# Patient Record
Sex: Male | Born: 1940 | Race: White | Hispanic: No | Marital: Married | Smoking: Never smoker
Health system: Southern US, Community
[De-identification: ages and names within clinical notes are randomized; demographics above are authoritative.]

## PROBLEM LIST (undated history)

## (undated) DIAGNOSIS — I10 Essential (primary) hypertension: Secondary | ICD-10-CM

## (undated) DIAGNOSIS — E119 Type 2 diabetes mellitus without complications: Secondary | ICD-10-CM

## (undated) DIAGNOSIS — I2510 Atherosclerotic heart disease of native coronary artery without angina pectoris: Secondary | ICD-10-CM

## (undated) DIAGNOSIS — Z952 Presence of prosthetic heart valve: Secondary | ICD-10-CM

## (undated) DIAGNOSIS — I00 Rheumatic fever without heart involvement: Secondary | ICD-10-CM

## (undated) DIAGNOSIS — I219 Acute myocardial infarction, unspecified: Secondary | ICD-10-CM

## (undated) HISTORY — PX: ORIF ANKLE FRACTURE: SUR919

## (undated) HISTORY — PX: WRIST SURGERY: SHX841

## (undated) HISTORY — PX: CORONARY ARTERY BYPASS GRAFT: SHX141

---

## 2012-04-27 ENCOUNTER — Emergency Department: Payer: Self-pay | Admitting: Emergency Medicine

## 2012-04-27 LAB — CBC
HCT: 40.5 % (ref 40.0–52.0)
HGB: 13.6 g/dL (ref 13.0–18.0)
MCHC: 33.7 g/dL (ref 32.0–36.0)
MCV: 85 fL (ref 80–100)
Platelet: 162 10*3/uL (ref 150–440)
RDW: 14.6 % — ABNORMAL HIGH (ref 11.5–14.5)
WBC: 9.2 10*3/uL (ref 3.8–10.6)

## 2012-04-27 LAB — BASIC METABOLIC PANEL
Anion Gap: 6 — ABNORMAL LOW (ref 7–16)
Chloride: 105 mmol/L (ref 98–107)
Co2: 29 mmol/L (ref 21–32)
Creatinine: 1.21 mg/dL (ref 0.60–1.30)
EGFR (African American): >60
EGFR (Non-African Amer.): 60 — ABNORMAL LOW
Glucose: 136 mg/dL — ABNORMAL HIGH (ref 65–99)
Sodium: 140 mmol/L (ref 136–145)

## 2012-04-27 LAB — TROPONIN I: Troponin-I: 0.04 ng/mL

## 2012-05-03 ENCOUNTER — Ambulatory Visit: Payer: Self-pay | Admitting: Internal Medicine

## 2012-06-26 ENCOUNTER — Observation Stay: Payer: Self-pay | Admitting: Internal Medicine

## 2012-06-26 LAB — URINALYSIS, COMPLETE
Hyaline Cast: 4
Ketone: NEGATIVE
Leukocyte Esterase: NEGATIVE
Nitrite: NEGATIVE
Ph: 6 (ref 4.5–8.0)
RBC,UR: <1 /HPF (ref 0–5)
Specific Gravity: 1.012 (ref 1.003–1.030)

## 2012-06-26 LAB — CBC
HCT: 35.3 % — ABNORMAL LOW (ref 40.0–52.0)
MCH: 27.4 pg (ref 26.0–34.0)
Platelet: 320 10*3/uL (ref 150–440)
RBC: 4.25 10*6/uL — ABNORMAL LOW (ref 4.40–5.90)
WBC: 14.4 10*3/uL — ABNORMAL HIGH (ref 3.8–10.6)

## 2012-06-26 LAB — COMPREHENSIVE METABOLIC PANEL
BUN: 27 mg/dL — ABNORMAL HIGH (ref 7–18)
Bilirubin,Total: 0.9 mg/dL (ref 0.2–1.0)
Calcium, Total: 8.7 mg/dL (ref 8.5–10.1)
Chloride: 102 mmol/L (ref 98–107)
Co2: 29 mmol/L (ref 21–32)
Creatinine: 1.35 mg/dL — ABNORMAL HIGH (ref 0.60–1.30)
EGFR (African American): >60
EGFR (Non-African Amer.): 52 — ABNORMAL LOW
Potassium: 4 mmol/L (ref 3.5–5.1)
SGOT(AST): 20 U/L (ref 15–37)
SGPT (ALT): 20 U/L (ref 12–78)
Sodium: 138 mmol/L (ref 136–145)
Total Protein: 6.9 g/dL (ref 6.4–8.2)

## 2012-06-26 LAB — CK TOTAL AND CKMB (NOT AT ARMC)
CK, Total: 25 U/L — ABNORMAL LOW (ref 35–232)
CK-MB: 1 ng/mL (ref 0.5–3.6)

## 2012-06-26 LAB — TROPONIN I: Troponin-I: <0.02 ng/mL

## 2012-06-27 LAB — CBC WITH DIFFERENTIAL/PLATELET
Basophil #: 0.1 10*3/uL (ref 0.0–0.1)
Basophil %: 1 %
Eosinophil #: 0.4 10*3/uL (ref 0.0–0.7)
HCT: 29.7 % — ABNORMAL LOW (ref 40.0–52.0)
HGB: 9.9 g/dL — ABNORMAL LOW (ref 13.0–18.0)
Lymphocyte #: 1.5 10*3/uL (ref 1.0–3.6)
Lymphocyte %: 14.7 %
MCH: 27.4 pg (ref 26.0–34.0)
MCHC: 33.4 g/dL (ref 32.0–36.0)
MCV: 82 fL (ref 80–100)
Monocyte %: 6.7 %
Neutrophil #: 7.4 10*3/uL — ABNORMAL HIGH (ref 1.4–6.5)
Neutrophil %: 73.3 %
Platelet: 264 10*3/uL (ref 150–440)
RBC: 3.61 10*6/uL — ABNORMAL LOW (ref 4.40–5.90)
RDW: 15.6 % — ABNORMAL HIGH (ref 11.5–14.5)

## 2012-06-27 LAB — BASIC METABOLIC PANEL
Anion Gap: 4 — ABNORMAL LOW (ref 7–16)
Anion Gap: 6 — ABNORMAL LOW (ref 7–16)
BUN: 27 mg/dL — ABNORMAL HIGH (ref 7–18)
BUN: 24 mg/dL — ABNORMAL HIGH (ref 7–18)
Chloride: 106 mmol/L (ref 98–107)
Chloride: 105 mmol/L (ref 98–107)
Co2: 29 mmol/L (ref 21–32)
Creatinine: 1.26 mg/dL (ref 0.60–1.30)
EGFR (Non-African Amer.): 54 — ABNORMAL LOW
Glucose: 107 mg/dL — ABNORMAL HIGH (ref 65–99)
Glucose: 121 mg/dL — ABNORMAL HIGH (ref 65–99)
Osmolality: 279 (ref 275–301)
Potassium: 3.8 mmol/L (ref 3.5–5.1)
Potassium: 4.3 mmol/L (ref 3.5–5.1)
Sodium: 137 mmol/L (ref 136–145)

## 2012-06-27 LAB — LIPID PANEL
HDL Cholesterol: 43 mg/dL (ref 40–60)
Ldl Cholesterol, Calc: 136 mg/dL — ABNORMAL HIGH (ref 0–100)
Triglycerides: 107 mg/dL (ref 0–200)

## 2012-06-27 LAB — TROPONIN I: Troponin-I: 0.02 ng/mL

## 2012-06-27 LAB — HEMOGLOBIN A1C: Hemoglobin A1C: 6.1 % (ref 4.2–6.3)

## 2012-07-18 ENCOUNTER — Encounter: Payer: Self-pay | Admitting: Cardiothoracic Surgery

## 2012-07-23 ENCOUNTER — Encounter: Payer: Self-pay | Admitting: Cardiothoracic Surgery

## 2012-08-22 ENCOUNTER — Encounter: Payer: Self-pay | Admitting: Cardiothoracic Surgery

## 2012-09-09 ENCOUNTER — Emergency Department: Payer: Self-pay | Admitting: Emergency Medicine

## 2012-09-09 LAB — CBC
HCT: 40.6 % (ref 40.0–52.0)
HGB: 13.5 g/dL (ref 13.0–18.0)
MCHC: 33.2 g/dL (ref 32.0–36.0)
Platelet: 163 10*3/uL (ref 150–440)
RBC: 5.04 10*6/uL (ref 4.40–5.90)
RDW: 17.1 % — ABNORMAL HIGH (ref 11.5–14.5)
WBC: 8.6 10*3/uL (ref 3.8–10.6)

## 2012-09-09 LAB — BASIC METABOLIC PANEL
Anion Gap: 5 — ABNORMAL LOW (ref 7–16)
BUN: 19 mg/dL — ABNORMAL HIGH (ref 7–18)
Calcium, Total: 9 mg/dL (ref 8.5–10.1)
Glucose: 123 mg/dL — ABNORMAL HIGH (ref 65–99)
Potassium: 3.9 mmol/L (ref 3.5–5.1)
Sodium: 137 mmol/L (ref 136–145)

## 2012-09-09 LAB — TROPONIN I: Troponin-I: <0.02 ng/mL

## 2012-09-22 ENCOUNTER — Encounter: Payer: Self-pay | Admitting: Cardiothoracic Surgery

## 2012-10-22 ENCOUNTER — Encounter: Payer: Self-pay | Admitting: Cardiothoracic Surgery

## 2012-11-22 ENCOUNTER — Encounter: Payer: Self-pay | Admitting: Cardiothoracic Surgery

## 2014-03-10 ENCOUNTER — Ambulatory Visit: Payer: Self-pay | Admitting: Internal Medicine
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
## 2014-08-14 NOTE — Discharge Summary (Signed)
PATIENT NAME:  Malik Dean, Revere MR#:  [id number] DATE OF BIRTH:  1940/12/27  DATE OF ADMISSION:  06/26/2012 DATE OF DISCHARGE:  06/27/2012  DISCHARGE DIAGNOSES:  1. Syncopal episode due to hypotension as a result of dehydration.  2. Acute renal failure due to dehydration. 3. Hypertension.  4. Diabetes. 5. Valvular heart disease status post valvular surgery.   PRIMARY CARDIOLOGIST: Dwayne D. Callwood, MD   HISTORY OF PRESENTING ILLNESS: A 74 year old Caucasian male with past medical history of significant hypertension, diabetes, coronary artery disease and aortic valvular regurgitation, status post recent aortic valve replacement at [facility] 10 days ago, brought to the hospital secondary to syncopal episode. The patient said that he had cardiac catheterization done by Dr. Juliann Paresallwood at the end of January and was found 100% RCA blockage with collateral and also had aortic valve regurgitation and [facility] He had aortic valve replacement 10 days ago, and that relieved his angina and dyspnea symptoms. [facility] and KCl. Has been eating and drinking fine. Not had any symptoms up until the morning of the day of presentation to the Emergency Room. He was feeling dizzy. Had 2 syncopal episodes and denied any chest pain prior to the episode. His blood pressure in the ER was on the lower side, which was corrected with IV hydration, and so he was admitted for observation for syncopal episode.   HOSPITAL COURSE AND STAY:  1. Syncopal episode was due to dehydration when he came with hypotension, and he was using Lasix increased dose [facility]. Echocardiogram was done, which was not significant. Cardiology consult was done by Dr. Juliann Paresallwood, and he suggested to discharge home as the patient was feeling fine after IV hydration.  2. Acute renal failure, likely this was due to prerenal from dehydration. Gentle IV hydration, and we held the Lasix. Metformin was also  held, and followup labs showed correction. 3. Leukocytosis, likely due to stress from the fall, and WBC came down later on.  4. Hypertension. We continued metoprolol along with holding parameter, but cut down on Lasix on discharge.  5. Diabetes. We held metformin due to acute renal failure, but then restarted later on on discharge.   IMPORTANT LABORATORY RESULTS: WBC 14.4, hemoglobin 11.6. Glucose 138, BUN 27, creatinine 1.35. Troponin less than 0.02. Chest x-ray: No acute disease. Urinalysis: Negative. US Carotid Doppler: 50% stenosis in proximal left artery. No hemodynamically significant blockages. WBC came down to 10,000. Creatinine improved to 1.31. Echocardiogram done, normal global left ventricular systolic function, moderate asymmetric left ventricular hypertrophy, small pericardial effusion, thickening of the anterior and posterior mitral valve leaflet, aortic valve is normally functioning, mild to moderate aortic valve sclerosis, severely increased left ventricular posterior wall thickness, thick tricuspid valve. CT of the head without contrast consistent with chronic ischemia. Creatinine improved to 1.26 later on.   CONDITION ON DISCHARGE: Stable.   CODE STATUS ON DISCHARGE: Full code.   MEDICATION ON DISCHARGE:  1. Meloxicam 50 mg oral tablet once a day. 2. Allopurinol 300 mg once a day.  3. Metformin 500 mg 2 times a day.  4. Aspirin 81 mg once a day.  5. Magnesium 1 tablet once a day.  6. Atorvastatin 40 mg once a day.  7. KCl 20 mEq once a day. 8. Lisinopril 2.5 mg once a day.  9. Metoprolol 25 mg 1/2-tablet 2 times a day.   DIET ON DISCHARGE: Low sodium, carbohydrate controlled, ADA diet.   DIET  CONSISTENCY: Regular.   ACTIVITY LIMITATION: As tolerated.   TIMEFRAME TO FOLLOWUP: In 1 to 2 weeks with Dr. Juliann Pares for adjustment of cardiac and blood pressure medication.   TOTAL TIME SPENT ON THIS DISCHARGE: 45 minutes.    ____________________________ Malik Dean  Elisabeth Pigeon, MD vgv:OSi D: 07/01/2012 23:53:10 ET T: 07/02/2012 11:35:30 ET JOB#: [id number]  cc: Malik Dean. Elisabeth Pigeon, MD, <Dictator> Dwayne D. Juliann Pares, MD Altamese Dilling MD ELECTRONICALLY SIGNED 07/22/2012 9:26

## 2014-11-19 ENCOUNTER — Emergency Department: Payer: Medicare Other

## 2014-11-19 ENCOUNTER — Emergency Department
Admission: EM | Admit: 2014-11-19 | Discharge: 2014-11-19 | Disposition: A | Discharge status: Home / Self Care | Payer: Medicare Other | Attending: Emergency Medicine | Admitting: Emergency Medicine

## 2014-11-19 DIAGNOSIS — R109 Unspecified abdominal pain: Secondary | ICD-10-CM

## 2014-11-19 DIAGNOSIS — N201 Calculus of ureter: Secondary | ICD-10-CM | POA: Diagnosis not present

## 2014-11-19 DIAGNOSIS — E119 Type 2 diabetes mellitus without complications: Secondary | ICD-10-CM | POA: Insufficient documentation

## 2014-11-19 DIAGNOSIS — I10 Essential (primary) hypertension: Secondary | ICD-10-CM | POA: Insufficient documentation

## 2014-11-19 HISTORY — DX: Type 2 diabetes mellitus without complications: E11.9

## 2014-11-19 HISTORY — DX: Presence of prosthetic heart valve: Z95.2

## 2014-11-19 HISTORY — DX: Acute myocardial infarction, unspecified: I21.9

## 2014-11-19 HISTORY — DX: Essential (primary) hypertension: I10

## 2014-11-19 LAB — URINALYSIS COMPLETE WITH MICROSCOPIC (ARMC ONLY)
BILIRUBIN URINE: NEGATIVE
Bacteria, UA: NONE SEEN
Glucose, UA: NEGATIVE mg/dL
Leukocytes, UA: NEGATIVE
Nitrite: NEGATIVE
Protein, ur: 100 mg/dL — AB
Specific Gravity, Urine: 1.026 (ref 1.005–1.030)
Squamous Epithelial / LPF: NONE SEEN
pH: 5 (ref 5.0–8.0)

## 2014-11-19 LAB — COMPREHENSIVE METABOLIC PANEL
ALBUMIN: 3.4 g/dL — AB (ref 3.5–5.0)
ALT: 14 U/L — AB (ref 17–63)
AST: 17 U/L (ref 15–41)
Alkaline Phosphatase: 64 U/L (ref 38–126)
Anion gap: 10 (ref 5–15)
BUN: 32 mg/dL — ABNORMAL HIGH (ref 6–20)
CHLORIDE: 103 mmol/L (ref 101–111)
CO2: 27 mmol/L (ref 22–32)
Calcium: 9 mg/dL (ref 8.9–10.3)
Creatinine, Ser: 1.94 mg/dL — ABNORMAL HIGH (ref 0.61–1.24)
GFR calc Af Amer: 38 mL/min — ABNORMAL LOW (ref >60)
GFR calc non Af Amer: 33 mL/min — ABNORMAL LOW (ref >60)
Glucose, Bld: 149 mg/dL — ABNORMAL HIGH (ref 65–99)
Potassium: 4 mmol/L (ref 3.5–5.1)
Sodium: 140 mmol/L (ref 135–145)
Total Bilirubin: 1.9 mg/dL — ABNORMAL HIGH (ref 0.3–1.2)
Total Protein: 6.8 g/dL (ref 6.5–8.1)

## 2014-11-19 LAB — CBC
HCT: 43.4 % (ref 40.0–52.0)
HEMOGLOBIN: 14.3 g/dL (ref 13.0–18.0)
MCH: 28 pg (ref 26.0–34.0)
MCHC: 33 g/dL (ref 32.0–36.0)
MCV: 84.8 fL (ref 80.0–100.0)
Platelets: 124 10*3/uL — ABNORMAL LOW (ref 150–440)
RBC: 5.13 MIL/uL (ref 4.40–5.90)
RDW: 14.7 % — ABNORMAL HIGH (ref 11.5–14.5)
WBC: 11.7 10*3/uL — ABNORMAL HIGH (ref 3.8–10.6)

## 2014-11-19 LAB — LIPASE, BLOOD: Lipase: 21 U/L — ABNORMAL LOW (ref 22–51)

## 2014-11-19 MED ORDER — TAMSULOSIN HCL 0.4 MG PO CAPS: 0.4000 mg | ORAL_CAPSULE | Freq: Every day | ORAL | Status: DC

## 2014-11-19 MED ORDER — OXYCODONE-ACETAMINOPHEN 5-325 MG PO TABS: 1.0000 | ORAL_TABLET | Freq: Four times a day (QID) | ORAL | Status: DC | PRN

## 2014-11-19 NOTE — Discharge Instructions (Signed)
Please call the number provided to follow-up with a urologist as soon as possible. Please bring the CT report your primary care doctor to arrange a follow-up CT in 3-6 months to reevaluate your kidney cyst. Please return to the emergency department for any worsening pain, fever, nausea or vomiting, or any other symptom personally concerning to yourself.   Kidney Stones Kidney stones (urolithiasis) are deposits that form inside your kidneys. The intense pain is caused by the stone moving through the urinary tract. When the stone moves, the ureter goes into spasm around the stone. The stone is usually passed in the urine.  CAUSES   A disorder that makes certain neck glands produce too much parathyroid hormone (primary hyperparathyroidism).  A buildup of uric acid crystals, similar to gout in your joints.  Narrowing (stricture) of the ureter.  A kidney obstruction present at birth (congenital obstruction).  Previous surgery on the kidney or ureters.  Numerous kidney infections. SYMPTOMS   Feeling sick to your stomach (nauseous).  Throwing up (vomiting).  Blood in the urine (hematuria).  Pain that usually spreads (radiates) to the groin.  Frequency or urgency of urination. DIAGNOSIS   Taking a history and physical exam.  Blood or urine tests.  CT scan.  Occasionally, an examination of the inside of the urinary bladder (cystoscopy) is performed. TREATMENT   Observation.  Increasing your fluid intake.  Extracorporeal shock wave lithotripsy--This is a noninvasive procedure that uses shock waves to break up kidney stones.  Surgery may be needed if you have severe pain or persistent obstruction. There are various surgical procedures. Most of the procedures are performed with the use of small instruments. Only small incisions are needed to accommodate these instruments, so recovery time is minimized. The size, location, and chemical composition are all important variables that  will determine the proper choice of action for you. Talk to your health care provider to better understand your situation so that you will minimize the risk of injury to yourself and your kidney.  HOME CARE INSTRUCTIONS   Drink enough water and fluids to keep your urine clear or pale yellow. This will help you to pass the stone or stone fragments.  Strain all urine through the provided strainer. Keep all particulate matter and stones for your health care provider to see. The stone causing the pain may be as small as a grain of salt. It is very important to use the strainer each and every time you pass your urine. The collection of your stone will allow your health care provider to analyze it and verify that a stone has actually passed. The stone analysis will often identify what you can do to reduce the incidence of recurrences.  Only take over-the-counter or prescription medicines for pain, discomfort, or fever as directed by your health care provider.  Make a follow-up appointment with your health care provider as directed.  Get follow-up X-rays if required. The absence of pain does not always mean that the stone has passed. It may have only stopped moving. If the urine remains completely obstructed, it can cause loss of kidney function or even complete destruction of the kidney. It is your responsibility to make sure X-rays and follow-ups are completed. Ultrasounds of the kidney can show blockages and the status of the kidney. Ultrasounds are not associated with any radiation and can be performed easily in a matter of minutes. SEEK MEDICAL CARE IF:  You experience pain that is progressive and unresponsive to any pain medicine you  have been prescribed. SEEK IMMEDIATE MEDICAL CARE IF:   Pain cannot be controlled with the prescribed medicine.  You have a fever or shaking chills.  The severity or intensity of pain increases over 18 hours and is not relieved by pain medicine.  You develop a new  onset of abdominal pain.  You feel faint or pass out.  You are unable to urinate. MAKE SURE YOU:   Understand these instructions.  Will watch your condition.  Will get help right away if you are not doing well or get worse. Document Released: 04/10/2005 Document Revised: 12/11/2012 Document Reviewed: 09/11/2012 [facility] Patient Information 2015 [place], [place]. This information is not intended to replace advice given to you by your health care provider. Make sure you discuss any questions you have with your health care provider.

## 2014-11-19 NOTE — ED Provider Notes (Signed)
[facility] Emergency Department Provider Note  Time seen: 11:10 AM  I have reviewed the triage vital signs and the nursing notes.   HISTORY  Chief Complaint Flank Pain and Abdominal Pain    HPI Malik Dean is a 74 y.o. male with a past medical history of hypertension, diabetes, MI, presents the emergency department with left-sided abdominal flank pain. According to the patient for the past 1 week he's had intermittent left flank pain. States the pain comes and goes, it was severe this morning but is now largely resolved. Denies any dysuria, hematuria. Denies any nausea, vomiting, diarrhea. His last bowel movement was yesterday and normal. Denies any chest pain or shortness of breath. Denies any fever. Describes his abdominal pain currently is gone. When the abdominal pain is severe he states it feels like a stabbing type pain.No modifying factors identified.     Past Medical History  Diagnosis Date  . Hypertension   . Diabetes mellitus without complication   . Heart valve replaced   . MI (myocardial infarction)     There are no active problems to display for this patient.   History reviewed. No pertinent past surgical history.  No current outpatient prescriptions on file.  Allergies Review of patient's allergies indicates no known allergies.  No family history on file.  Social History History  Substance Use Topics  . Smoking status: Never Smoker   . Smokeless tobacco: Not on file  . Alcohol Use: No    Review of Systems Constitutional: Negative for fever. Cardiovascular: Negative for chest pain. Respiratory: Negative for shortness of breath. Gastrointestinal: Left-sided flank pain. Genitourinary: Negative for dysuria. Negative for hematuria. Musculoskeletal: Negative for back pain. 10-point ROS otherwise negative.  ____________________________________________   PHYSICAL EXAM:  VITAL SIGNS: ED Triage Vitals  Enc Vitals Group   BP 11/19/14 1036 181/78 mmHg     Pulse Rate 11/19/14 1036 87     Resp 11/19/14 1036 18     Temp 11/19/14 1036 98.4 F (36.9 C)     Temp Source 11/19/14 1036 Oral     SpO2 11/19/14 1036 95 %     Weight 11/19/14 1036 300 lb (136.079 kg)     Height 11/19/14 1036 6' (1.829 m)     Head Cir --      Peak Flow --      Pain Score 11/19/14 1042 5     Pain Loc --      Pain Edu? --      Excl. in GC? --     Constitutional: Alert and oriented. Well appearing and in no distress. Eyes: Normal exam ENT   Mouth/Throat: Mucous membranes are moist. Cardiovascular: Normal rate, regular rhythm. No murmur Respiratory: Normal respiratory effort without tachypnea nor retractions. Breath sounds are clear and equal bilaterally. No wheezes/rales/rhonchi. Gastrointestinal: Soft, obese, nontender to palpation. No distention. No CVA tenderness palpation. Musculoskeletal: Nontender with normal range of motion in all extremities.  Neurologic:  Normal speech and language. No gross focal neurologic deficits Skin:  Skin is warm, dry and intact.  Psychiatric: Mood and affect are normal. Speech and behavior are normal.  ____________________________________________   RADIOLOGY  CT consistent with left kidney stone  ____________________________________________    INITIAL IMPRESSION / ASSESSMENT AND PLAN / ED COURSE  Pertinent labs & imaging results that were available during my care of the patient were reviewed by me and considered in my medical decision making (see chart for details).  Patient with left-sided flank pain times  one week. States the pain as intermittent, sharp and severe when present, and will completely resolved between episodes. Denies dysuria or hematuria. Denies any history of kidney stones. We will check labs, closely monitor, and proceed with CT scan of the abdomen to rule out a kidney stone.  CT consistent with ureterolithiasis. Labs are largely within normal limits. Urinalysis is most  consistent with ureterolithiasis as well. Patient states he is feeling much better currently. We'll discharge the patient home on pain medication and Flomax. Patient follow-up with a urologist. I also discussed with the patient renal cysts found on CT scan. I printed out the CT scan report to the patient to bring to his primary care doctor to arrange a follow-up CT scan in 3-6 months. Patient is agreeable.  ____________________________________________   FINAL CLINICAL IMPRESSION(S) / ED DIAGNOSES  Left flank pain Ureterolithiasis  Minna Antis, MD 11/19/14 1334

## 2014-11-19 NOTE — ED Notes (Signed)
Pt c/o left sided flank pain since "the weekend". Denies nausea, vomiting or diarrhea.

## 2014-11-19 NOTE — ED Notes (Signed)
Pt alert and oriented X4, active, cooperative, pt in NAD. RR even and unlabored, color WNL.    

## 2014-12-14 ENCOUNTER — Ambulatory Visit: Payer: Self-pay | Admitting: Urology

## 2015-05-12 ENCOUNTER — Other Ambulatory Visit: Payer: Self-pay | Admitting: Neurology

## 2015-05-12 DIAGNOSIS — R413 Other amnesia: Secondary | ICD-10-CM

## 2015-05-12 DIAGNOSIS — R4701 Aphasia: Secondary | ICD-10-CM

## 2015-05-24 ENCOUNTER — Ambulatory Visit
Admission: RE | Admit: 2015-05-24 | Discharge: 2015-05-24 | Disposition: A | Discharge status: Home / Self Care | Payer: Medicare Other | Source: Ambulatory Visit | Attending: Neurology | Admitting: Neurology

## 2015-05-24 DIAGNOSIS — R413 Other amnesia: Secondary | ICD-10-CM | POA: Insufficient documentation

## 2015-05-24 DIAGNOSIS — R4701 Aphasia: Secondary | ICD-10-CM | POA: Diagnosis present

## 2015-05-24 DIAGNOSIS — G319 Degenerative disease of nervous system, unspecified: Secondary | ICD-10-CM | POA: Insufficient documentation

## 2015-05-24 DIAGNOSIS — I679 Cerebrovascular disease, unspecified: Secondary | ICD-10-CM | POA: Insufficient documentation

## 2015-11-29 ENCOUNTER — Inpatient Hospital Stay
Admission: EM | Admit: 2015-11-29 | Discharge: 2015-11-30 | DRG: 312 | Disposition: A | Discharge status: Home Health | Payer: Medicare Other | Attending: Internal Medicine | Admitting: Internal Medicine

## 2015-11-29 ENCOUNTER — Emergency Department: Payer: Medicare Other

## 2015-11-29 ENCOUNTER — Inpatient Hospital Stay: Admit: 2015-11-29 | Payer: Medicare Other

## 2015-11-29 ENCOUNTER — Encounter: Payer: Self-pay | Admitting: Emergency Medicine

## 2015-11-29 DIAGNOSIS — F0390 Unspecified dementia without behavioral disturbance: Secondary | ICD-10-CM | POA: Diagnosis present

## 2015-11-29 DIAGNOSIS — G458 Other transient cerebral ischemic attacks and related syndromes: Secondary | ICD-10-CM | POA: Diagnosis not present

## 2015-11-29 DIAGNOSIS — Z23 Encounter for immunization: Secondary | ICD-10-CM | POA: Diagnosis not present

## 2015-11-29 DIAGNOSIS — G4733 Obstructive sleep apnea (adult) (pediatric): Secondary | ICD-10-CM | POA: Diagnosis present

## 2015-11-29 DIAGNOSIS — Z7902 Long term (current) use of antithrombotics/antiplatelets: Secondary | ICD-10-CM | POA: Diagnosis not present

## 2015-11-29 DIAGNOSIS — I350 Nonrheumatic aortic (valve) stenosis: Secondary | ICD-10-CM | POA: Diagnosis present

## 2015-11-29 DIAGNOSIS — Z8249 Family history of ischemic heart disease and other diseases of the circulatory system: Secondary | ICD-10-CM

## 2015-11-29 DIAGNOSIS — Z888 Allergy status to other drugs, medicaments and biological substances status: Secondary | ICD-10-CM | POA: Diagnosis not present

## 2015-11-29 DIAGNOSIS — Z8673 Personal history of transient ischemic attack (TIA), and cerebral infarction without residual deficits: Secondary | ICD-10-CM | POA: Diagnosis not present

## 2015-11-29 DIAGNOSIS — Z951 Presence of aortocoronary bypass graft: Secondary | ICD-10-CM | POA: Diagnosis not present

## 2015-11-29 DIAGNOSIS — Z823 Family history of stroke: Secondary | ICD-10-CM

## 2015-11-29 DIAGNOSIS — E119 Type 2 diabetes mellitus without complications: Secondary | ICD-10-CM | POA: Diagnosis present

## 2015-11-29 DIAGNOSIS — I951 Orthostatic hypotension: Principal | ICD-10-CM | POA: Diagnosis present

## 2015-11-29 DIAGNOSIS — Z7982 Long term (current) use of aspirin: Secondary | ICD-10-CM | POA: Diagnosis not present

## 2015-11-29 DIAGNOSIS — E785 Hyperlipidemia, unspecified: Secondary | ICD-10-CM | POA: Diagnosis present

## 2015-11-29 DIAGNOSIS — I639 Cerebral infarction, unspecified: Secondary | ICD-10-CM | POA: Diagnosis present

## 2015-11-29 DIAGNOSIS — I252 Old myocardial infarction: Secondary | ICD-10-CM

## 2015-11-29 DIAGNOSIS — I2510 Atherosclerotic heart disease of native coronary artery without angina pectoris: Secondary | ICD-10-CM | POA: Diagnosis present

## 2015-11-29 DIAGNOSIS — Z952 Presence of prosthetic heart valve: Secondary | ICD-10-CM | POA: Diagnosis not present

## 2015-11-29 DIAGNOSIS — R531 Weakness: Secondary | ICD-10-CM | POA: Diagnosis not present

## 2015-11-29 DIAGNOSIS — Z79899 Other long term (current) drug therapy: Secondary | ICD-10-CM | POA: Diagnosis not present

## 2015-11-29 DIAGNOSIS — N400 Enlarged prostate without lower urinary tract symptoms: Secondary | ICD-10-CM | POA: Diagnosis present

## 2015-11-29 DIAGNOSIS — R4182 Altered mental status, unspecified: Secondary | ICD-10-CM | POA: Diagnosis not present

## 2015-11-29 HISTORY — DX: Atherosclerotic heart disease of native coronary artery without angina pectoris: I25.10

## 2015-11-29 HISTORY — DX: Rheumatic fever without heart involvement: I00

## 2015-11-29 LAB — CBC WITH DIFFERENTIAL/PLATELET
Basophils Absolute: 0 10*3/uL (ref 0–0.1)
Basophils Relative: 1 %
Eosinophils Absolute: 0.1 10*3/uL (ref 0–0.7)
Eosinophils Relative: 1 %
HEMATOCRIT: 40.6 % (ref 40.0–52.0)
Hemoglobin: 14 g/dL (ref 13.0–18.0)
LYMPHS PCT: 15 %
Lymphs Abs: 1.3 10*3/uL (ref 1.0–3.6)
MCH: 29 pg (ref 26.0–34.0)
MCHC: 34.5 g/dL (ref 32.0–36.0)
MCV: 84 fL (ref 80.0–100.0)
MONO ABS: 0.6 10*3/uL (ref 0.2–1.0)
MONOS PCT: 7 %
Neutro Abs: 6.6 10*3/uL — ABNORMAL HIGH (ref 1.4–6.5)
Neutrophils Relative %: 76 %
Platelets: 137 10*3/uL — ABNORMAL LOW (ref 150–440)
RBC: 4.83 MIL/uL (ref 4.40–5.90)
RDW: 15.2 % — ABNORMAL HIGH (ref 11.5–14.5)
WBC: 8.6 10*3/uL (ref 3.8–10.6)

## 2015-11-29 LAB — URINE DRUG SCREEN, QUALITATIVE (ARMC ONLY)
AMPHETAMINES, UR SCREEN: NOT DETECTED
BARBITURATES, UR SCREEN: NOT DETECTED
BENZODIAZEPINE, UR SCRN: NOT DETECTED
CANNABINOID 50 NG, UR ~~LOC~~: NOT DETECTED
Cocaine Metabolite,Ur ~~LOC~~: NOT DETECTED
MDMA (ECSTASY) UR SCREEN: NOT DETECTED
METHADONE SCREEN, URINE: NOT DETECTED
OPIATE, UR SCREEN: NOT DETECTED
Phencyclidine (PCP) Ur S: NOT DETECTED
Tricyclic, Ur Screen: NOT DETECTED

## 2015-11-29 LAB — URINALYSIS COMPLETE WITH MICROSCOPIC (ARMC ONLY)
BILIRUBIN URINE: NEGATIVE
Bacteria, UA: NONE SEEN
GLUCOSE, UA: NEGATIVE mg/dL
HGB URINE DIPSTICK: NEGATIVE
Ketones, ur: NEGATIVE mg/dL
LEUKOCYTES UA: NEGATIVE
NITRITE: NEGATIVE
Protein, ur: NEGATIVE mg/dL
RBC / HPF: NONE SEEN RBC/hpf (ref 0–5)
SQUAMOUS EPITHELIAL / LPF: NONE SEEN
Specific Gravity, Urine: 1.019 (ref 1.005–1.030)
pH: 7 (ref 5.0–8.0)

## 2015-11-29 LAB — PROTIME-INR
INR: 1
Prothrombin Time: 13.2 seconds (ref 11.4–15.2)

## 2015-11-29 LAB — COMPREHENSIVE METABOLIC PANEL
ALBUMIN: 3.4 g/dL — AB (ref 3.5–5.0)
ALK PHOS: 72 U/L (ref 38–126)
ALT: 16 U/L — ABNORMAL LOW (ref 17–63)
ANION GAP: 6 (ref 5–15)
AST: 18 U/L (ref 15–41)
BUN: 26 mg/dL — AB (ref 6–20)
CO2: 30 mmol/L (ref 22–32)
Calcium: 8.9 mg/dL (ref 8.9–10.3)
Chloride: 104 mmol/L (ref 101–111)
Creatinine, Ser: 1.09 mg/dL (ref 0.61–1.24)
GFR calc Af Amer: >60 mL/min (ref >60)
GFR calc non Af Amer: >60 mL/min (ref >60)
GLUCOSE: 118 mg/dL — AB (ref 65–99)
POTASSIUM: 4.3 mmol/L (ref 3.5–5.1)
SODIUM: 140 mmol/L (ref 135–145)
Total Bilirubin: 1.1 mg/dL (ref 0.3–1.2)
Total Protein: 6.2 g/dL — ABNORMAL LOW (ref 6.5–8.1)

## 2015-11-29 LAB — APTT: aPTT: 33 seconds (ref 24–36)

## 2015-11-29 LAB — TROPONIN I: Troponin I: <0.03 ng/mL (ref <0.03)

## 2015-11-29 MED ORDER — PNEUMOCOCCAL VAC POLYVALENT 25 MCG/0.5ML IJ INJ
0.5000 mL | INJECTION | INTRAMUSCULAR | Status: AC
  Administered 2015-11-30: 0.5 mL via INTRAMUSCULAR
  Filled 2015-11-29: qty 0.5

## 2015-11-29 MED ORDER — ATORVASTATIN CALCIUM 20 MG PO TABS
40.0000 mg | ORAL_TABLET | Freq: Every day | ORAL | Status: DC
  Administered 2015-11-29: 40 mg via ORAL
  Filled 2015-11-29 (×2): qty 2
  Filled 2015-11-29: qty 1

## 2015-11-29 MED ORDER — TAMSULOSIN HCL 0.4 MG PO CAPS
0.4000 mg | ORAL_CAPSULE | Freq: Every day | ORAL | Status: DC
  Administered 2015-11-30: 10:00:00 0.4 mg via ORAL
  Filled 2015-11-29: qty 1

## 2015-11-29 MED ORDER — ESCITALOPRAM OXALATE 10 MG PO TABS
20.0000 mg | ORAL_TABLET | Freq: Every day | ORAL | Status: DC
  Administered 2015-11-30: 10:00:00 20 mg via ORAL
  Filled 2015-11-29: qty 2

## 2015-11-29 MED ORDER — CLOPIDOGREL BISULFATE 75 MG PO TABS
75.0000 mg | ORAL_TABLET | Freq: Every day | ORAL | Status: DC
  Administered 2015-11-30: 10:00:00 75 mg via ORAL
  Filled 2015-11-29: qty 1

## 2015-11-29 MED ORDER — STROKE: EARLY STAGES OF RECOVERY BOOK
Freq: Once | Status: AC
  Administered 2015-11-29: 19:00:00

## 2015-11-29 MED ORDER — ENOXAPARIN SODIUM 40 MG/0.4ML ~~LOC~~ SOLN
40.0000 mg | SUBCUTANEOUS | Status: DC
  Administered 2015-11-29: 23:00:00 40 mg via SUBCUTANEOUS
  Filled 2015-11-29 (×2): qty 0.4

## 2015-11-29 MED ORDER — ASPIRIN EC 81 MG PO TBEC
81.0000 mg | DELAYED_RELEASE_TABLET | Freq: Every day | ORAL | Status: DC
  Administered 2015-11-30: 10:00:00 81 mg via ORAL
  Filled 2015-11-29: qty 1

## 2015-11-29 MED ORDER — LISINOPRIL 10 MG PO TABS: 20.0000 mg | ORAL_TABLET | Freq: Every day | ORAL | Status: DC

## 2015-11-29 MED ORDER — DONEPEZIL HCL 5 MG PO TABS
5.0000 mg | ORAL_TABLET | Freq: Every day | ORAL | Status: DC
  Administered 2015-11-29: 5 mg via ORAL
  Filled 2015-11-29 (×2): qty 1

## 2015-11-29 MED ORDER — SODIUM CHLORIDE 0.9 % IV SOLN
INTRAVENOUS | Status: DC
  Administered 2015-11-29 – 2015-11-30 (×2): via INTRAVENOUS

## 2015-11-29 MED ORDER — ATENOLOL 25 MG PO TABS: 50.0000 mg | ORAL_TABLET | Freq: Every day | ORAL | Status: DC

## 2015-11-29 NOTE — Consult Note (Signed)
Referring Physician: Cyril LoosenKinner    Chief Complaint: Lethargy, low BP  HPI: Malik Dean is an 75 y.o. male with a history of dementia and difficulty with gait who is accompanied by wife today who provides much of the history. It appears that the patient has been excessively lethargic for the past 3 days.  Today was being seen by PT and was lethargic as well.  She was having difficulty keeping him awake.  She noted that his BP was low and he became lethargic to the point of drooling.  Patient had difficulty ambulating which was worse than at baseline.  Patient bought to [facility] for evaluation.  On initial evaluation it was felt that the patient had a facial droop and right leg weakness.  Code stroke was called at that time.  Initial NIHSS of 3.   Patient has been seeing a PT for the past few months due to difficulty with gait and multiple falls.  The wife has not noted much in the way of improvement. Had a questionable stroke in January and has not returned to baseline but appears to be deteriorating.     Date last known well: Unable to determine Time last known well: Unable to determine tPA Given: No: Unable to determine LKW, back to baseline  Past Medical History:  Diagnosis Date  . Diabetes mellitus without complication (HCC)   . Heart valve replaced   . Hypertension   . MI (myocardial infarction) [facility](HCC)     Surgical history: Right wrist reconstruction, ORIF right ankle fracture, Aortic valve replacement, CABG  Family history: Sister has had a stroke.  Father s/p AVR, now deceased.    Social History:  reports that he has never smoked. He has never used smokeless tobacco. He reports that he does not drink alcohol. His drug history is not on file.  Allergies: No Known Allergies  Medications: I have reviewed the patient's current medications. Prior to Admission:  Prior to Admission medications   Medication Sig Start Date End Date Taking? Authorizing Provider  amiodarone (PACERONE) 200 MG  tablet Take 1 tablet by mouth daily. 02/16/14   Historical Provider, MD  aspirin EC 81 MG tablet Take 1 tablet by mouth daily.    Historical Provider, MD  atenolol (TENORMIN) 50 MG tablet Take 1 tablet by mouth daily. 02/16/14   Historical Provider, MD  atorvastatin (LIPITOR) 40 MG tablet Take 1 tablet by mouth daily. 02/16/14   Historical Provider, MD  doxazosin (CARDURA) 1 MG tablet Take 1 tablet by mouth daily. 02/16/14   Historical Provider, MD  lisinopril (PRINIVIL,ZESTRIL) 20 MG tablet Take 1 tablet by mouth daily. 02/16/14   Historical Provider, MD  oxyCODONE-acetaminophen (ROXICET) 5-325 MG per tablet Take 1 tablet by mouth every 6 (six) hours as needed. 11/19/14   Minna AntisKevin Paduchowski, MD  tamsulosin (FLOMAX) 0.4 MG CAPS capsule Take 1 capsule (0.4 mg total) by mouth daily. 11/19/14   Minna AntisKevin Paduchowski, MD     ROS: History obtained from the patient  General ROS: fatigue, generalized weakness Psychological ROS: negative for - behavioral disorder, hallucinations, memory difficulties, mood swings or suicidal ideation Ophthalmic ROS: negative for - blurry vision, double vision, eye pain or loss of vision ENT ROS: negative for - epistaxis, nasal discharge, oral lesions, sore throat, tinnitus or vertigo Allergy and Immunology ROS: negative for - hives or itchy/watery eyes Hematological and Lymphatic ROS: negative for - bleeding problems, bruising or swollen lymph nodes Endocrine ROS: negative for - galactorrhea, hair pattern changes, polydipsia/polyuria or temperature  intolerance Respiratory ROS: negative for - cough, hemoptysis, shortness of breath or wheezing Cardiovascular ROS: negative for - chest pain, dyspnea on exertion, edema or irregular heartbeat Gastrointestinal ROS: negative for - abdominal pain, diarrhea, hematemesis, nausea/vomiting or stool incontinence Genito-Urinary ROS: negative for - dysuria, hematuria, incontinence or urinary frequency/urgency Musculoskeletal ROS: negative  for - joint swelling or muscular weakness Neurological ROS: as noted in HPI Dermatological ROS: negative for rash and skin lesion changes  Physical Examination: Blood pressure (!) 163/78, pulse (!) 58, temperature 98 F (36.7 C), temperature source Oral, resp. rate 14, SpO2 97 %.  HEENT-  Normocephalic, no lesions, without obvious abnormality.  Normal external eye and conjunctiva.  Normal TM's bilaterally.  Normal auditory canals and external ears. Normal external nose, mucus membranes and septum.  Normal pharynx. Cardiovascular- S1, S2 normal, pulses palpable throughout   Lungs- chest clear, no wheezing, rales, normal symmetric air entry Abdomen- soft, non-tender; bowel sounds normal; no masses,  no organomegaly Extremities- no edema Lymph-no adenopathy palpable Musculoskeletal-no joint tenderness, deformity or swelling Skin-warm and dry, no hyperpigmentation, vitiligo, or suspicious lesions  Neurological Examination Mental Status: Alert, place, name and month but can not tell me his age.  Thought content appropriate.  Speech fluent without evidence of aphasia.  Able to follow 3 step commands without difficulty. Cranial Nerves: II: Discs flat bilaterally; Visual fields grossly normal, pupils equal, round, reactive to light and accommodation III,IV, VI: ptosis not present, extra-ocular motions intact bilaterally V,VII: decrease in right NLF, facial light touch sensation normal bilaterally VIII: hearing normal bilaterally IX,X: gag reflex present XI: bilateral shoulder shrug XII: midline tongue extension Motor: Right : Upper extremity   5/5    Left:     Upper extremity   5/5  Lower extremity   5/5     Lower extremity   5-/5 Tone and bulk:normal tone throughout; no atrophy noted Sensory: Pinprick and light touch intact throughout, bilaterally Deep Tendon Reflexes: 2+ and symmetric with absent AJ's bilaterally Plantars: Right: downgoing   Left: downgoing Cerebellar: Normal  finger-to-nose and normal heel-to-shin testing bilaterally Gait: not tested due to safety concerns   Laboratory Studies:  Basic Metabolic Panel: No results for input(s): NA, K, CL, CO2, GLUCOSE, BUN, CREATININE, CALCIUM, MG, PHOS in the last 168 hours.  Liver Function Tests: No results for input(s): AST, ALT, ALKPHOS, BILITOT, PROT, ALBUMIN in the last 168 hours. No results for input(s): LIPASE, AMYLASE in the last 168 hours. No results for input(s): AMMONIA in the last 168 hours.  CBC:  Recent Labs Lab 11/29/15 1207  WBC 8.6  NEUTROABS 6.6*  HGB 14.0  HCT 40.6  MCV 84.0  PLT 137*    Cardiac Enzymes: No results for input(s): CKTOTAL, CKMB, CKMBINDEX, TROPONINI in the last 168 hours.  BNP: Invalid input(s): POCBNP  CBG: No results for input(s): GLUCAP in the last 168 hours.  Microbiology: No results found for this or any previous visit.  Coagulation Studies: No results for input(s): LABPROT, INR in the last 72 hours.  Urinalysis: No results for input(s): COLORURINE, LABSPEC, PHURINE, GLUCOSEU, HGBUR, BILIRUBINUR, KETONESUR, PROTEINUR, UROBILINOGEN, NITRITE, LEUKOCYTESUR in the last 168 hours.  Invalid input(s): APPERANCEUR  Lipid Panel:    Component Value Date/Time   CHOL 200 06/27/2012 0710   TRIG 107 06/27/2012 0710   HDL 43 06/27/2012 0710   VLDL 21 06/27/2012 0710   LDLCALC 136 (H) 06/27/2012 0710    HgbA1C:  Lab Results  Component Value Date   HGBA1C 6.1 06/27/2012  Urine Drug Screen:  No results found for: LABOPIA, COCAINSCRNUR, LABBENZ, AMPHETMU, THCU, LABBARB  Alcohol Level: No results for input(s): ETH in the last 168 hours.   Imaging: Ct Head Wo Contrast  Result Date: 11/29/2015 CLINICAL DATA:  Weakness. EXAM: CT HEAD WITHOUT CONTRAST TECHNIQUE: Contiguous axial images were obtained from the base of the skull through the vertex without intravenous contrast. COMPARISON:  06/27/2012.  05/24/2015. FINDINGS: There is no evidence for acute  hemorrhage, hydrocephalus, mass lesion, or abnormal extra-axial fluid collection. No definite CT evidence for acute infarction. Diffuse loss of parenchymal volume is consistent with atrophy. Patchy low attenuation in the deep hemispheric and periventricular white matter is nonspecific, but likely reflects chronic microvascular ischemic demyelination. 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 IMPRESSION: 1. Stable exam.  No acute intracranial abnormality 2. 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 Electronically Signed   By: Kennith Center M.D.   On: 11/29/2015 12:05    Assessment: 75 y.o. male presenting with lethargy and questionable new onset of right facial droop and right leg weakness.  It appears that the patient has been lethargic for a few days and with lower extremity weakness for a few months.  On wife's evaluation of patient she did not note change in his face and I suspect that the mild facial droop is not new.  Wife now feels that the patient is back to baseline.  Suspect that his symptoms were more related to his low blood pressure than to a TIA or stroke.  Patient has been found to be bradycardic while here as well which may be contributing also.  Head CT personally reviewed and shows atrophy and mall vessel ischemic changes but no acute abnormalities.  Patient on ASA.    Stroke Risk Factors - diabetes mellitus and hypertension  Plan: 1. HgbA1c, fasting lipid panel 2. MRI, MRA  of the brain without contrast 3. PT consult, OT consult, Speech consult 4. Echocardiogram 5. Carotid dopplers 6. Prophylactic therapy-Continue ASA 7. NPO until RN stroke swallow screen 8. Telemetry monitoring 9. Frequent neuro checks 10. EEG  Case discussed with Dr. Cyril Loosen.    Thana Farr, MD Neurology [phone] 11/29/2015, 12:42 PM

## 2015-11-29 NOTE — ED Triage Notes (Signed)
Pt to ed with c/o weakness this am while doing PT at home.  Per wife pt became weak and could not stand.  Pt wife states pt has had decreased LOC for the last several days and appears lethargic.  Pt alert at triage but noted right sided facial drooping and right leg weakness.  Pt HR at triage is 48.

## 2015-11-29 NOTE — H&P (Signed)
[facility] - [place] at [facility]   PATIENT NAME: Malik Dean    MR#:  [id number]  DATE OF BIRTH:  [birth year]  DATE OF ADMISSION:  11/29/2015  PRIMARY CARE PHYSICIAN: Derwood Kaplan, MD   REQUESTING/REFERRING PHYSICIAN: Jene Every MD  CHIEF COMPLAINT:   Chief Complaint  Patient presents with  . Weakness    HISTORY OF PRESENT ILLNESS: Malik Dean  is a 75 y.o. male with a known history of Diabetes type 2,  History of porcine heart valve replacement, , hypertension and coronary artery disease who is presenting with lethargy for the past 3 days. Patient was being seen by physical therapy and was lethargic. He needed to be woken up. His wife noticed that his blood pressure was low and became lethargic and was drooling. patient came to the ED and initially thought to have a stroke.Neurologist saw the patient, they did not feel that this was a stroke felt that this could be related to his low blood pressure and bradycardia. Patient feels that he is weak but has no focal deficits. He has no further drooping of his face      PAST MEDICAL HISTORY:   Past Medical History:  Diagnosis Date  . A-fib (HCC)   . Aortic stenosis   . CAD (coronary artery disease)   . CHF (congestive heart failure) (HCC)   . Diabetes mellitus without complication (HCC)   . Heart valve replaced   . Hypertension   . MI (myocardial infarction) (HCC)   . OSA (obstructive sleep apnea)   . Rheumatic fever     PAST SURGICAL HISTORY:  Past Surgical History:  Procedure Laterality Date  . CORONARY ARTERY BYPASS GRAFT    . ORIF ANKLE FRACTURE    . WRIST SURGERY      SOCIAL HISTORY:  Social History  Substance Use Topics  . Smoking status: Never Smoker  . Smokeless tobacco: Never Used  . Alcohol use No    FAMILY HISTORY:  Family History  Problem Relation Age of Onset  . Hypertension Father     DRUG ALLERGIES:  Allergies  Allergen Reactions  . Amlodipine Other (See  Comments)    dizziness    REVIEW OF SYSTEMS:   CONSTITUTIONAL: No fever,Positive fatigue and weakness.  EYES: No blurred or double vision.  EARS, NOSE, AND THROAT: No tinnitus or ear pain.  RESPIRATORY: No cough, shortness of breath, wheezing or hemoptysis.  CARDIOVASCULAR: No chest pain, orthopnea, edema.  GASTROINTESTINAL: No nausea, vomiting, diarrhea or abdominal pain.  GENITOURINARY: No dysuria, hematuria.  ENDOCRINE: No polyuria, nocturia,  HEMATOLOGY: No anemia, easy bruising or bleeding SKIN: No rash or lesion. MUSCULOSKELETAL: No joint pain or arthritis.   NEUROLOGIC: No tingling, numbness, weakness.  PSYCHIATRY: No anxiety or depression.   MEDICATIONS AT HOME:  Prior to Admission medications   Medication Sig Start Date End Date Taking? Authorizing Provider  aspirin EC 81 MG tablet Take 1 tablet by mouth daily.   Yes Historical Provider, MD  atenolol (TENORMIN) 50 MG tablet Take 1 tablet by mouth daily. 02/16/14  Yes Historical Provider, MD  atorvastatin (LIPITOR) 40 MG tablet Take 1 tablet by mouth daily at 6 PM.  02/16/14  Yes Historical Provider, MD  clopidogrel (PLAVIX) 75 MG tablet Take 75 mg by mouth daily.   Yes Historical Provider, MD  donepezil (ARICEPT) 5 MG tablet Take 5 mg by mouth at bedtime.   Yes Historical Provider, MD  escitalopram (LEXAPRO) 20 MG tablet Take  20 mg by mouth daily.   Yes Historical Provider, MD  lisinopril (PRINIVIL,ZESTRIL) 20 MG tablet Take 1 tablet by mouth daily. 02/16/14  Yes Historical Provider, MD  tamsulosin (FLOMAX) 0.4 MG CAPS capsule Take 1 capsule (0.4 mg total) by mouth daily. 11/19/14  Yes Minna AntisKevin Paduchowski, MD      PHYSICAL EXAMINATION:   VITAL SIGNS: Blood pressure (!) 182/89, pulse 85, temperature 98 F (36.7 C), temperature source Oral, resp. rate 15, SpO2 (!) 89 %.  GENERAL:  75 y.o.-year-old patient lying in the bed with no acute distress.  EYES: Pupils equal, round, reactive to light and accommodation. No scleral  icterus. Extraocular muscles intact.  HEENT: Head atraumatic, normocephalic. Oropharynx and nasopharynx clear.  NECK:  Supple, no jugular venous distention. No thyroid enlargement, no tenderness.  LUNGS: Normal breath sounds bilaterally, no wheezing, rales,rhonchi or crepitation. No use of accessory muscles of respiration.  CARDIOVASCULAR: S1, S2 normal. No murmurs, rubs, or gallops.  ABDOMEN: Soft, nontender, nondistended. Bowel sounds present. No organomegaly or mass.  EXTREMITIES: No pedal edema, cyanosis, or clubbing.  NEUROLOGIC: Cranial nerves II through XII are intact. Muscle strength 5/5 in all extremities. Sensation intact. Gait not checked.  PSYCHIATRIC: The patient is alert and oriented x 3.  SKIN: No obvious rash, lesion, or ulcer.   LABORATORY PANEL:   CBC  Recent Labs Lab 11/29/15 1207  WBC 8.6  HGB 14.0  HCT 40.6  PLT 137*  MCV 84.0  MCH 29.0  MCHC 34.5  RDW 15.2*  LYMPHSABS 1.3  MONOABS 0.6  EOSABS 0.1  BASOSABS 0.0   ------------------------------------------------------------------------------------------------------------------  Chemistries   Recent Labs Lab 11/29/15 1207  NA 140  K 4.3  CL 104  CO2 30  GLUCOSE 118*  BUN 26*  CREATININE 1.09  CALCIUM 8.9  AST 18  ALT 16*  ALKPHOS 72  BILITOT 1.1   ------------------------------------------------------------------------------------------------------------------ CrCl cannot be calculated (Unknown ideal weight.). ------------------------------------------------------------------------------------------------------------------ No results for input(s): TSH, T4TOTAL, T3FREE, THYROIDAB in the last 72 hours.  Invalid input(s): FREET3   Coagulation profile  Recent Labs Lab 11/29/15 1207  INR 1.00   ------------------------------------------------------------------------------------------------------------------- No results for input(s): DDIMER in the last 72  hours. -------------------------------------------------------------------------------------------------------------------  Cardiac Enzymes  Recent Labs Lab 11/29/15 1207  TROPONINI <0.03   ------------------------------------------------------------------------------------------------------------------ Invalid input(s): POCBNP  ---------------------------------------------------------------------------------------------------------------  Urinalysis    Component Value Date/Time   COLORURINE YELLOW (A) 11/29/2015 1207   APPEARANCEUR CLEAR (A) 11/29/2015 1207   APPEARANCEUR Hazy 06/26/2012 1530   LABSPEC 1.019 11/29/2015 1207   LABSPEC 1.012 06/26/2012 1530   PHURINE 7.0 11/29/2015 1207   GLUCOSEU NEGATIVE 11/29/2015 1207   GLUCOSEU Negative 06/26/2012 1530   HGBUR NEGATIVE 11/29/2015 1207   BILIRUBINUR NEGATIVE 11/29/2015 1207   BILIRUBINUR Negative 06/26/2012 1530   KETONESUR NEGATIVE 11/29/2015 1207   PROTEINUR NEGATIVE 11/29/2015 1207   NITRITE NEGATIVE 11/29/2015 1207   LEUKOCYTESUR NEGATIVE 11/29/2015 1207   LEUKOCYTESUR Negative 06/26/2012 1530     RADIOLOGY: Ct Head Wo Contrast  Result Date: 11/29/2015 CLINICAL DATA:  Weakness. EXAM: CT HEAD WITHOUT CONTRAST TECHNIQUE: Contiguous axial images were obtained from the base of the skull through the vertex without intravenous contrast. COMPARISON:  06/27/2012.  05/24/2015. FINDINGS: There is no evidence for acute hemorrhage, hydrocephalus, mass lesion, or abnormal extra-axial fluid collection. No definite CT evidence for acute infarction. Diffuse loss of parenchymal volume is consistent with atrophy. Patchy low attenuation in the deep hemispheric and periventricular white matter is nonspecific, but likely reflects chronic microvascular ischemic demyelination. Lacunar infarctions  for seen in the basal ganglia bilaterally and left cerebellar hemisphere, as before.The visualized paranasal sinuses and mastoid air cells are clear.  IMPRESSION: 1. Stable exam.  No acute intracranial abnormality 2. 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 Electronically Signed   By: Kennith Center M.D.   On: 11/29/2015 12:05   US Carotid Bilateral  Result Date: 11/29/2015 CLINICAL DATA:  CVA, syncopal episode and visual disturbance. History of hypertension and hyperlipidemia. EXAM: BILATERAL CAROTID DUPLEX ULTRASOUND TECHNIQUE: Wallace Cullens scale imaging, color Doppler and duplex ultrasound were performed of bilateral carotid and vertebral arteries in the neck. COMPARISON:  Carotid Doppler ultrasound - 06/26/2012 FINDINGS: Criteria: Quantification of carotid stenosis is based on velocity parameters that correlate the residual internal carotid diameter with NASCET-based stenosis levels, using the diameter of the distal internal carotid lumen as the denominator for stenosis measurement. The following velocity measurements were obtained: RIGHT ICA:  94/26 cm/sec CCA:  86/13 cm/sec SYSTOLIC ICA/CCA RATIO:  1.1 DIASTOLIC ICA/CCA RATIO:  2.0 ECA:  143 cm/sec LEFT ICA:  150/45 cm/sec CCA:  90/17 cm/sec SYSTOLIC ICA/CCA RATIO:  1.7 DIASTOLIC ICA/CCA RATIO:  2.6 ECA:  119 cm/sec RIGHT CAROTID ARTERY: There is a minimal amount of eccentric mixed echogenic plaque within the right carotid bulb (image 15 and 16). There is a moderate amount of eccentric mixed echogenic plaque involving the origin and proximal aspect the right internal carotid artery (image 23), not resulting in elevated peak systolic velocities within the interrogated course the right internal carotid artery to suggest a hemodynamically significant stenosis. RIGHT VERTEBRAL ARTERY:  Antegrade Flow LEFT CAROTID ARTERY: There is a moderate amount of eccentric mixed echogenic plaque within the left carotid bulb (images 46 and 47). There is a moderate to large amount of eccentric mixed echogenic densely shadowing plaque involving the origin and proximal aspect the left internal carotid  artery (image 54),, likely progressed since the 06/2012 examination and again results in elevated peak systolic velocities within the mid and distal aspects of the left internal carotid artery (greatest acquired peak systolic velocity within distal aspect the left internal carotid artery measures 150 cm/sec- image 63, previously, 155 cm/sec). LEFT VERTEBRAL ARTERY: Antegrade flow, though lack of antegrade diastolic flow was demonstrated. IMPRESSION: 1. Suspected progression of left-sided atherosclerotic plaque again resulting in elevated peak systolic velocities within the left internal carotid artery compatible with the 50-69% luminal narrowing range. Further evaluation with CTA could be performed as clinically indicated. 2. Grossly unchanged right-sided atherosclerotic plaque, not resulting in a hemodynamically significant stenosis. 3. Antegrade flow is demonstrated within the left vertebral artery however there is lack of antegrade diastolic flow which represents an interval change compared to the 06/2012 examination. Again, this could be further evaluated at the time of CTA as indicated. 4. Antegrade flow demonstrated within the right vertebral artery. Electronically Signed   By: Simonne Come M.D.   On: 11/29/2015 15:09    EKG: Orders placed or performed during the hospital encounter of 11/29/15  . ED EKG  . ED EKG    IMPRESSION AND PLAN: Patient is a 75 year old white male presenting with weakness and episode of unresponsiveness 1. Decreasing responsiveness Seen by neurology recommended MRI and MRA of the brain to rule out a stroke His symptoms could be related to his hypotension and bradycardia- we'll monitor his blood pressure and his heart rate Past PT to see  2. Bradycardia I'll stop his atenolol  3. Hypotension at home I will hold his blood pressure medications for  now. May need these resumed her blood pressure starts increasing  4. Coronary artery disease Continue aspirin and  Plavix  5. Hyperlipidemia. Continue atorvastatin  6. MISC: Lovenox for DVT prophylaxis    All the records are reviewed and case discussed with ED provider. Management plans discussed with the patient, family and they are in agreement.  CODE STATUS:    Code Status Orders        Start     Ordered   11/29/15 1400  Full code  Continuous     11/29/15 1400    Code Status History    Date Active Date Inactive Code Status Order ID Comments User Context   This patient has a current code status but no historical code status.       TOTAL TIME TAKING CARE OF THIS PATIENT: 55 minutes.    Auburn Bilberry M.D on 11/29/2015 at 3:46 PM  Between 7am to 6pm - Pager - [phone]  After 6pm go to [email or web address] - password [email or web address] [facility]  [facility]  Office  [phone]  CC: Primary care physician; Derwood Kaplan, MD

## 2015-11-29 NOTE — ED Notes (Signed)
CODE STROKE CALLED TO 333 

## 2015-11-29 NOTE — ED Provider Notes (Signed)
[facility] Emergency Department Provider Note   ____________________________________________    I have reviewed the triage vital signs and the nursing notes.   HISTORY  Chief Complaint Weakness   History limited by patient's dementia  HPI Malik Dean is a 75 y.o. male who presents with weakness. Per patient's wife during physical therapy proximally one hour ago this morning the patient became acutely weak and confused. However she notes that he has been sleeping more than usual over the last 3 days. He denies fevers or cough. Reported history of stroke in January and gradual decline since then. Patient denies chest pain or dizziness to me   Past Medical History:  Diagnosis Date  . Diabetes mellitus without complication (HCC)   . Heart valve replaced   . Hypertension   . MI (myocardial infarction) (HCC)     There are no active problems to display for this patient.   History reviewed. No pertinent surgical history.  Prior to Admission medications   Medication Sig Start Date End Date Taking? Authorizing Provider  amiodarone (PACERONE) 200 MG tablet Take 1 tablet by mouth daily. 02/16/14   Historical Provider, MD  aspirin EC 81 MG tablet Take 1 tablet by mouth daily.    Historical Provider, MD  atenolol (TENORMIN) 50 MG tablet Take 1 tablet by mouth daily. 02/16/14   Historical Provider, MD  atorvastatin (LIPITOR) 40 MG tablet Take 1 tablet by mouth daily. 02/16/14   Historical Provider, MD  doxazosin (CARDURA) 1 MG tablet Take 1 tablet by mouth daily. 02/16/14   Historical Provider, MD  lisinopril (PRINIVIL,ZESTRIL) 20 MG tablet Take 1 tablet by mouth daily. 02/16/14   Historical Provider, MD  oxyCODONE-acetaminophen (ROXICET) 5-325 MG per tablet Take 1 tablet by mouth every 6 (six) hours as needed. 11/19/14   Minna Antis, MD  tamsulosin (FLOMAX) 0.4 MG CAPS capsule Take 1 capsule (0.4 mg total) by mouth daily. 11/19/14   Minna Antis,  MD     Allergies Review of patient's allergies indicates no known allergies.  No family history on file.  Social History Social History  Substance Use Topics  . Smoking status: Never Smoker  . Smokeless tobacco: Never Used  . Alcohol use No    Review of Systems  Constitutional: No fever/chills Eyes: No visual changes.  ENT: No Neck pain Cardiovascular: Denies chest pain. Respiratory: Denies shortness of breath. Gastrointestinal: No abdominal pain.    Musculoskeletal: Negative for back pain. Skin: Negative for rash. Neurological: Negative for headaches no focal weakness  10-point ROS otherwise negative.  ____________________________________________   PHYSICAL EXAM:  VITAL SIGNS: ED Triage Vitals [11/29/15 1127]  Enc Vitals Group     BP (!) 121/56     Pulse Rate (!) 48     Resp 18     Temp 98 F (36.7 C)     Temp Source Oral     SpO2 97 %     Weight      Height      Head Circumference      Peak Flow      Pain Score      Pain Loc      Pain Edu?      Excl. in GC?     Constitutional: Alert. No acute distress. Pleasant and interactive Eyes: Conjunctivae are normal.  Head: Atraumatic. Nose: No congestion/rhinnorhea. Mouth/Throat: Mucous membranes are moist.   Neck:  Painless ROM Cardiovascular: Normal rate, regular rhythm. Grossly normal heart sounds.  Good peripheral  circulation. Respiratory: Normal respiratory effort.  No retractions. Lungs CTAB. Gastrointestinal: Soft and nontender. No distention.  No CVA tenderness. Genitourinary: deferred Musculoskeletal: No lower extremity tenderness nor edema.  Warm and well perfused Neurologic:  Normal speech and language. Mild right-sided facial droop not involving the forehead, otherwise no neurological deficits Skin:  Skin is warm, dry and intact. No rash noted. Psychiatric: Mood and affect are normal. Speech and behavior are normal.  ____________________________________________   LABS (all labs ordered  are listed, but only abnormal results are displayed)  Labs Reviewed  COMPREHENSIVE METABOLIC PANEL - Abnormal; Notable for the following:       Result Value   Glucose, Bld 118 (*)    BUN 26 (*)    Total Protein 6.2 (*)    Albumin 3.4 (*)    ALT 16 (*)    All other components within normal limits  CBC WITH DIFFERENTIAL/PLATELET - Abnormal; Notable for the following:    RDW 15.2 (*)    Platelets 137 (*)    Neutro Abs 6.6 (*)    All other components within normal limits  TROPONIN I  APTT  PROTIME-INR  URINE DRUG SCREEN, QUALITATIVE ([facility] ONLY)  URINALYSIS COMPLETEWITH MICROSCOPIC ([facility] ONLY)   ____________________________________________  EKG  ED ECG REPORT I, Jene EveryKINNER, Jamiaya Bina, the attending physician, personally viewed and interpreted this ECG.  Date: 11/29/2015 EKG Time: 11:46 AM Rate: 52 Rhythm: sinus bradycardia QRS Axis: normal Intervals: normal ST/T Wave abnormalities: normal Conduction Disturbances: Nonspecific IVCD   ____________________________________________  RADIOLOGY  CT head unremarkable ____________________________________________   PROCEDURES  Procedure(s) performed: No    Critical Care performed: No ____________________________________________   INITIAL IMPRESSION / ASSESSMENT AND PLAN / ED COURSE  Pertinent labs & imaging results that were available during my care of the patient were reviewed by me and considered in my medical decision making (see chart for details).  After examining the patient and discussing with wife I opted to call a code stroke although this is somewhat unclear when the actual time of onset of the patient's facial droop was. In reviewing medical records there is no mention of it one month ago however the wife says he looks normal to her  Dr. Thad Rangereynolds of neurology has seen the patient and recommended canceling the code stroke but admitting the patient for neurological workup  Clinical Course    ____________________________________________   FINAL CLINICAL IMPRESSION(S) / ED DIAGNOSES  Final diagnoses:  Cerebral infarction due to unspecified mechanism      NEW MEDICATIONS STARTED DURING THIS VISIT:  New Prescriptions   No medications on file     Note:  This document was prepared using Dragon voice recognition software and may include unintentional dictation errors.    Jene Everyobert Larico Dimock, MD 11/29/15 [phone]

## 2015-11-29 NOTE — ED Notes (Signed)
Attempted to call report to 1c at 1748. RN said to call back. I asked for charge RN. Was put on hold for 6 min. Hung up and Chartered certified accountantcalled supervisor. Got answering machine.

## 2015-11-30 ENCOUNTER — Inpatient Hospital Stay
Admit: 2015-11-30 | Discharge: 2015-11-30 | Disposition: A | Discharge status: Home / Self Care | Payer: Medicare Other | Attending: Internal Medicine | Admitting: Internal Medicine

## 2015-11-30 DIAGNOSIS — I951 Orthostatic hypotension: Secondary | ICD-10-CM | POA: Diagnosis not present

## 2015-11-30 DIAGNOSIS — R4182 Altered mental status, unspecified: Secondary | ICD-10-CM

## 2015-11-30 LAB — ECHOCARDIOGRAM COMPLETE
Height: 72 in
WEIGHTICAEL: 4520 [oz_av]

## 2015-11-30 LAB — BASIC METABOLIC PANEL
Anion gap: 8 (ref 5–15)
BUN: 23 mg/dL — AB (ref 6–20)
CHLORIDE: 106 mmol/L (ref 101–111)
CO2: 25 mmol/L (ref 22–32)
Calcium: 8.8 mg/dL — ABNORMAL LOW (ref 8.9–10.3)
Creatinine, Ser: 1.03 mg/dL (ref 0.61–1.24)
GFR calc Af Amer: >60 mL/min (ref >60)
GFR calc non Af Amer: >60 mL/min (ref >60)
Glucose, Bld: 104 mg/dL — ABNORMAL HIGH (ref 65–99)
Potassium: 4.4 mmol/L (ref 3.5–5.1)
SODIUM: 139 mmol/L (ref 135–145)

## 2015-11-30 LAB — LIPID PANEL
CHOL/HDL RATIO: 4.2 ratio
Cholesterol: 182 mg/dL (ref 0–200)
HDL: 43 mg/dL (ref >40)
LDL Cholesterol: 118 mg/dL — ABNORMAL HIGH (ref 0–99)
Triglycerides: 105 mg/dL (ref <150)
VLDL: 21 mg/dL (ref 0–40)

## 2015-11-30 LAB — CBC
HEMATOCRIT: 40 % (ref 40.0–52.0)
Hemoglobin: 13.6 g/dL (ref 13.0–18.0)
MCH: 29.1 pg (ref 26.0–34.0)
MCHC: 34 g/dL (ref 32.0–36.0)
MCV: 85.5 fL (ref 80.0–100.0)
Platelets: 129 10*3/uL — ABNORMAL LOW (ref 150–440)
RBC: 4.68 MIL/uL (ref 4.40–5.90)
RDW: 15 % — AB (ref 11.5–14.5)
WBC: 8.2 10*3/uL (ref 3.8–10.6)

## 2015-11-30 LAB — HEMOGLOBIN A1C: HEMOGLOBIN A1C: 6 % (ref 4.0–6.0)

## 2015-11-30 MED ORDER — FINASTERIDE 5 MG PO TABS: 5.0000 mg | ORAL_TABLET | Freq: Every day | ORAL | 0 refills | Status: AC

## 2015-11-30 MED ORDER — FINASTERIDE 5 MG PO TABS: 5.0000 mg | ORAL_TABLET | Freq: Every day | ORAL | Status: DC

## 2015-11-30 NOTE — Evaluation (Signed)
Occupational Therapy Evaluation Patient Details Name: Malik Dean MRN: [id number] DOB: 03/14/1941 Today's Date: 11/30/2015    History of Present Illness presented to ER secondary to episode of decreased responsiveness, R sided weakness, hypotension and bradycardia; admitted to rule out TIA/CVA.  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   Clinical Impression   Pt is 75 year old male who presented to ER with R sided weakness, hypotension with bradycardia.  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  He presents with good strength in BUEs and hands with intact sensation and no vision changes but has decreased balance and problem solving when leaning forward for LB dressing skills and has orthostatic HTN (see PT notes) that resumes when sitting.  He requires min assist for LB dressing skills and mod cues for sequencing and safety.  He is able to complete automatic tasks like feeding and grooming but needs prompts to complete other tasks that are not automatic. He is at risk for falls due to decreased balance and impaired cognition which was baseline. Pt's wife reports he has been incontinent of bowel and bladder at times and that his room does not smell good due to this.  Rec continued OT in hospital and [facility]  [facility].     Follow Up Recommendations  Home health OT    Equipment Recommendations   (rec shower chair and grab bars and BSC over toilet)    Recommendations for Other Services       Precautions / Restrictions Precautions Precautions: Fall Restrictions Weight Bearing Restrictions: No      Mobility Bed Mobility Overal bed mobility: Needs Assistance             General bed mobility comments: per OT, sup for supine to sit (patient sitting edge of bed with OT upon PT arrival to session)  Transfers Overall transfer level: Needs  assistance Equipment used: Rolling walker (2 wheeled) Transfers: Sit to/from Stand Sit to Stand: Min guard         General transfer comment: increased time, multiple attempts required for sit/stand    Balance Overall balance assessment: Needs assistance Sitting-balance support: No upper extremity supported;Feet supported Sitting balance-Leahy Scale: Good     Standing balance support: Bilateral upper extremity supported Standing balance-Leahy Scale: Fair                              ADL Overall ADL's : Needs assistance/impaired Eating/Feeding: Independent;Set up   Grooming: Wash/dry hands;Wash/dry face;Oral care;Applying deodorant;Brushing hair;Set up;Min guard           Upper Body Dressing : Independent;Set up;Sitting   Lower Body Dressing: Set up;Minimal assistance;Cueing for safety;Cueing for sequencing;Sit to/from stand   Toilet Transfer: Min guard;Set up;Cueing for safety           Functional mobility during ADLs: Cueing for safety;Min guard;Rolling walker General ADL Comments: Pt presents with good strength in BUEs and hands but has decreased balance and problem solving when leaning forward and has orthostatic HTN (see PT notes) that resumes when sitting.  He requires min assist for LB dressing skills and mod cues for sequencing and safety.  He is able to complete automatic tasks like feeding and grooming but needs prompts to complete other tasks that are not automatic.     Vision  Perception     Praxis      Pertinent Vitals/Pain Pain Assessment: No/denies pain     Hand Dominance Right   Extremity/Trunk Assessment Upper Extremity Assessment Upper Extremity Assessment: Overall WFL for tasks assessed   Lower Extremity Assessment Lower Extremity Assessment: Defer to PT evaluation       Communication Communication Communication: HOH   Cognition Arousal/Alertness: Awake/alert Behavior During Therapy: WFL for tasks  assessed/performed Overall Cognitive Status: History of cognitive impairments - at baseline       Memory: Decreased short-term memory             General Comments       Exercises       Shoulder Instructions      Home Living Family/patient expects to be discharged to:: Private residence Living Arrangements: Spouse/significant other Available Help at Discharge: Family Type of Home: House Home Access: Stairs to enter Secretary/administrator of Steps: 5 Entrance Stairs-Rails: Left;Can reach both;Right Home Layout: One level     Bathroom Shower/Tub: Producer, television/film/video: Standard Bathroom Accessibility: Yes How Accessible: Accessible via walker Home Equipment: Walker - 4 wheels;Cane - single point          Prior Functioning/Environment Level of Independence: Independent with assistive device(s)        Comments: Mod indep for ADLs, household and very limited community mobility.  Does endorse multiple fall history--at least 4 in past six months, most recent approx 2 weeks prior to admission. His wife said he needs a lot of encouragment to take a shower due to hx of dementia.  Some incontinence with bowel and bladder.    OT Diagnosis: Generalized weakness;Cognitive deficits   OT Problem List: Decreased activity tolerance;Decreased safety awareness;Decreased cognition   OT Treatment/Interventions: Self-care/ADL training;Therapeutic activities;Balance training;Patient/family education    OT Goals(Current goals can be found in the care plan section) Acute Rehab OT Goals Patient Stated Goal: to return home and continue with PT OT Goal Formulation: With patient/family Time For Goal Achievement: 12/14/15 Potential to Achieve Goals: Good ADL Goals Pt Will Perform Lower Body Dressing: with supervision;with set-up;sit to/from stand (with stable ANS and no LOB) Pt Will Transfer to Toilet: with supervision;with set-up;regular height toilet (using FWW and no  LOB) Pt Will Perform Toileting - Clothing Manipulation and hygiene: with set-up;with supervision;sit to/from stand  OT Frequency: Min 1X/week   Barriers to D/C:            Co-evaluation              End of Session Equipment Utilized During Treatment: Gait belt Nurse Communication:  (pt used urinal standing and urinated 200 mls and NSG updated)  Activity Tolerance: Patient tolerated treatment well Patient left: in chair;with call bell/phone within reach;with chair alarm set;with family/visitor present   Time: 1610-9604 OT Time Calculation (min): 43 min Charges:  OT General Charges $OT Visit: 1 Procedure OT Evaluation $OT Eval Moderate Complexity: 1 Procedure OT Treatments $Self Care/Home Management : 23-37 mins G-Codes:     Susanne Borders, OTR/L ascom [phone] 11/30/15, 11:22 AM

## 2015-11-30 NOTE — Care Management (Signed)
Admitted to [facility] with the diagnosis of CVA. Lives with wife, Joyce GrossKay. States he hasn't seen Dr. Maryellen PileEason in a long time. Receiving [facility] in the home through [facility] ([facility]). No skilled facility. No home oxygen. Uses a rolling walker and cane to aid in ambulation. No Life Alert. Last fall was 2 weeks ago. Good appetite. Takes care of all basic activities of daily living himself, doesn't drive. [facility] on [address]. Wife will transport. Gwenette GreetBrenda S Courtney Fenlon RN MSN CCM Care Management [phone]

## 2015-11-30 NOTE — Progress Notes (Signed)
MD making rounds. Received discharge orders. IV removed.Provided with education handout. Prescription given to patient. Discharge paperwork provided, explained, signed and witnessed.No unanswered questions. Discharged via wheelchair bu volunteer services. Belongings sent with patient.

## 2015-11-30 NOTE — Care Management (Signed)
Discharge to home today per Dr. Renae GlossWieting. Will fax orders to [facility]. Wife will transport. Gwenette GreetBrenda s Vignesh Willert RN MSN CCM Care Management [phone]

## 2015-11-30 NOTE — Evaluation (Signed)
Physical Therapy Evaluation Patient Details Name: Malik NimrodWoodroe Vanderveen MRN: [id number] DOB: [birth year] Today's Date: 11/30/2015   History of Present Illness  presented to ER secondary to episode of decreased responsiveness, R sided weakness, hypotension and bradycardia; admitted to rule out TIA/CVA.  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  Clinical Impression  Upon evaluation, patient alert and oriented; follows all commands, somewhat HOH.  Bilat UE/LE strength and ROM grossly symmetrical and functional for basic transfers and mobility; no focal weakness or sensory deficit appreciated.  Able to complete sit/stand, basic transfers and gait (150') with RW, cga/min assist; mildly antalgic to R LE due to baseline deficits (related to previous R LE injury).  No buckling or LOB; distance limited by fatigue in R ankle.  Gait speed and dynamic balance abilities decreased compared to age-matched norms; indicative of high fall risk with functional mobility.  Recommend continued use of RW (vs. 4WRW) and +1 for all mobility at this time.  Patient noted with positive orthostatics during transition to upright, though asymptomatic.  RN informed/aware. Would benefit from skilled PT to address above deficits and promote optimal return to PLOF; Recommend transition to HHPT upon discharge from acute hospitalization.      Follow Up Recommendations Home health PT;Supervision/Assistance - 24 hour    Equipment Recommendations  Rolling walker with 5" wheels    Recommendations for Other Services       Precautions / Restrictions Precautions Precautions: Fall Restrictions Weight Bearing Restrictions: No      Mobility  Bed Mobility Overal bed mobility: Needs Assistance             General bed mobility comments: per OT, sup for supine to sit (patient sitting edge of bed with OT upon PT arrival to session)  Transfers Overall transfer  level: Needs assistance Equipment used: Rolling walker (2 wheeled) Transfers: Sit to/from Stand Sit to Stand: Min guard         General transfer comment: increased time, multiple attempts required for sit/stand  Ambulation/Gait Ambulation/Gait assistance: Min guard;Min assist Ambulation Distance (Feet): 150 Feet Assistive device: Rolling walker (2 wheeled)   Gait velocity: 10' walk time, 10 seconds Gait velocity interpretation: <1.8 ft/sec, indicative of risk for recurrent falls General Gait Details: partially reciprocal gait pattern with decreased step height/length, stance time on R LE (history of R LE injury with surgical repair).  Excessive weight shift to  L LE throughout gait cycle.  No overt buckling or LOB; no subjective reports of orthostasis.  Stairs            Wheelchair Mobility    Modified Rankin (Stroke Patients Only)       Balance Overall balance assessment: Needs assistance Sitting-balance support: No upper extremity supported;Feet supported Sitting balance-Leahy Scale: Good     Standing balance support: Bilateral upper extremity supported Standing balance-Leahy Scale: Fair                               Pertinent Vitals/Pain Pain Assessment: No/denies pain    Home Living Family/patient expects to be discharged to:: Private residence Living Arrangements: Spouse/significant other Available Help at Discharge: Family Type of Home: House Home Access: Stairs to enter Entrance Stairs-Rails: Left;Can reach Therapist, artboth;Right Entrance Stairs-Number of Steps: 5 Home Layout: One level Home Equipment: Environmental consultantWalker - 4 wheels;Cane - single point      Prior Function Level of Independence: Independent  with assistive device(s)         Comments: Mod indep for ADLs, household and very limited community mobility.  Does endorse multiple fall history--at least 4 in past six months, most recent approx 2 weeks prior to admission.     Hand Dominance    Dominant Hand: Right    Extremity/Trunk Assessment   Upper Extremity Assessment: Overall WFL for tasks assessed           Lower Extremity Assessment: Overall WFL for tasks assessed (grossly 4+/5 throughout; no focal weakness or sensory deficit appreciated)         Communication   Communication: HOH  Cognition Arousal/Alertness: Awake/alert Behavior During Therapy: WFL for tasks assessed/performed Overall Cognitive Status: Within Functional Limits for tasks assessed                      General Comments      Exercises        Assessment/Plan    PT Assessment Patient needs continued PT services  PT Diagnosis Difficulty walking;Generalized weakness   PT Problem List Decreased activity tolerance;Decreased balance;Decreased mobility;Decreased safety awareness  PT Treatment Interventions DME instruction;Gait training;Stair training;Functional mobility training;Therapeutic activities;Therapeutic exercise;Balance training;Patient/family education   PT Goals (Current goals can be found in the Care Plan section) Acute Rehab PT Goals Patient Stated Goal: to return home and continue with PT PT Goal Formulation: With patient/family Time For Goal Achievement: 12/14/15 Potential to Achieve Goals: Good    Frequency Min 2X/week   Barriers to discharge Decreased caregiver support      Co-evaluation               End of Session Equipment Utilized During Treatment: Gait belt Activity Tolerance: Patient tolerated treatment well Patient left: in chair;with call bell/phone within reach;with chair alarm set;with family/visitor present Nurse Communication: Mobility status (positive orthostatics)         Time: 4098-1191 PT Time Calculation (min) (ACUTE ONLY): 26 min   Charges:   PT Evaluation $PT Eval Moderate Complexity: 1 Procedure     PT G Codes:       Jemia Fata H. Manson Passey, PT, DPT, NCS 11/30/15, 11:05 AM [phone]

## 2015-11-30 NOTE — Discharge Instructions (Addendum)
Must stay Hydrated Exercise with physical therapy Wear ted hose during day and take off at night  Stop Lisinopril, Atenolol and Flomax.  Maybe able to restart lisinopril at follow up appointment if not orthostatic at that time.

## 2015-11-30 NOTE — Progress Notes (Addendum)
Speech Therapy Note: received order, reviewed chart notes and consulted NSG then met w/ pt and wife who was present in room w/ pt. Wife and pt denied any difficulty w/ swallowing; NSG stated pt swallowed all pills w/ water adequately and w/ no overt s/s of aspiration noted. Wife stated she felt pt was at his baseline; pt does have weakness for which he has been receiving [facility] PT; multiple medical issues. MD/Neurology indicated MRI showed no acute changes; chronic issues.  No ST services indicated at this time as wife indicated pt was at his baseline. Wife to f/u w/ primary MD if any concerns in future. Pt is being discharged home today. Wife agreed. NSG updated.

## 2015-11-30 NOTE — Progress Notes (Signed)
Subjective: Patient up in chair without complaints.  Wife present in the room.  Objective: Current vital signs: BP (!) 160/59 (BP Location: Right Arm)   Pulse (!) 55   Temp 98.1 F (36.7 C) (Oral)   Resp 17   Ht 6' (1.829 m)   Wt 128.1 kg (282 lb 8 oz)   SpO2 96%   BMI 38.31 kg/m  Vital signs in last 24 hours: Temp:  [98.1 F (36.7 C)-98.4 F (36.9 C)] 98.1 F (36.7 C) (08/08 0456) Pulse Rate:  [49-85] 55 (08/08 0456) Resp:  [12-18] 17 (08/08 0456) BP: (160-185)/(53-89) 160/59 (08/08 0456) SpO2:  [72 %-100 %] 96 % (08/08 0456) Weight:  [128.1 kg (282 lb 8 oz)] 128.1 kg (282 lb 8 oz) (08/07 1840)  Intake/Output from previous day: 08/07 0701 - 08/08 0700 In: 0  Out: 870 [Urine:870] Intake/Output this shift: Total I/O In: 240 [P.O.:240] Out: -  Nutritional status: Diet Heart Room service appropriate? Yes; Fluid consistency: Thin  Neurologic Exam: Mental Status: Alert.  Thought content appropriate.  Speech fluent without evidence of aphasia.  Able to follow 3 step commands without difficulty. Cranial Nerves: II: Discs flat bilaterally; Visual fields grossly normal, pupils equal, round, reactive to light and accommodation III,IV, VI: ptosis not present, extra-ocular motions intact bilaterally V,VII: decrease in right NLF, facial light touch sensation normal bilaterally VIII: hearing normal bilaterally IX,X: gag reflex present XI: bilateral shoulder shrug XII: midline tongue extension Motor: 5/5 throughout   Lab Results: Basic Metabolic Panel:  Recent Labs Lab 11/29/15 1207 11/30/15 0448  NA 140 139  K 4.3 4.4  CL 104 106  CO2 30 25  GLUCOSE 118* 104*  BUN 26* 23*  CREATININE 1.09 1.03  CALCIUM 8.9 8.8*    Liver Function Tests:  Recent Labs Lab 11/29/15 1207  AST 18  ALT 16*  ALKPHOS 72  BILITOT 1.1  PROT 6.2*  ALBUMIN 3.4*   No results for input(s): LIPASE, AMYLASE in the last 168 hours. No results for input(s): AMMONIA in the last 168  hours.  CBC:  Recent Labs Lab 11/29/15 1207 11/30/15 0448  WBC 8.6 8.2  NEUTROABS 6.6*  --   HGB 14.0 13.6  HCT 40.6 40.0  MCV 84.0 85.5  PLT 137* 129*    Cardiac Enzymes:  Recent Labs Lab 11/29/15 1207  TROPONINI <0.03    Lipid Panel:  Recent Labs Lab 11/30/15 0448  CHOL 182  TRIG 105  HDL 43  CHOLHDL 4.2  VLDL 21  LDLCALC 161*    CBG: No results for input(s): GLUCAP in the last 168 hours.  Microbiology: No results found for this or any previous visit.  Coagulation Studies:  Recent Labs  11/29/15 1207  LABPROT 13.2  INR 1.00    Imaging: Ct Head Wo Contrast  Result Date: 11/29/2015 CLINICAL DATA:  Weakness. EXAM: CT HEAD WITHOUT CONTRAST TECHNIQUE: Contiguous axial images were obtained from the base of the skull through the vertex without intravenous contrast. COMPARISON:  06/27/2012.  05/24/2015. FINDINGS: There is no evidence for acute hemorrhage, hydrocephalus, mass lesion, or abnormal extra-axial fluid collection. No definite CT evidence for acute infarction. Diffuse loss of parenchymal volume is consistent with atrophy. Patchy low attenuation in the deep hemispheric and periventricular white matter is nonspecific, but likely reflects chronic microvascular ischemic demyelination. 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 IMPRESSION: 1. Stable exam.  No acute intracranial  abnormality 2. 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 Electronically Signed   By: Kennith Center M.D.   On: 11/29/2015 12:05   Mr Maxine Glenn Head Wo Contrast  Result Date: 11/29/2015 CLINICAL DATA:  Dementia. Questionable new onset of right facial droop and right leg weakness. EXAM: MRI HEAD WITHOUT CONTRAST MRA HEAD WITHOUT CONTRAST TECHNIQUE: Multiplanar, multiecho pulse sequences of the brain and surrounding structures were obtained  without intravenous contrast. Angiographic images of the head were obtained using MRA technique without contrast. COMPARISON:  Head CT from earlier today FINDINGS: MRI HEAD FINDINGS Calvarium and upper cervical spine: No focal marrow signal abnormality. Orbits: Negative. Sinuses and Mastoids: Clear. Brain: No acute infarct, hemorrhage, hydrocephalus, or mass lesion. Abnormal appearance of the left V4 segment. There is chronic abnormal left vertebral artery Doppler waveform in the neck. Advanced chronic microvascular disease with ischemic gliosis confluent in the deep cerebral white matter and discrete lacunar infarcts in the bilateral thalamus and basal ganglia. Remote small to moderate left cerebellar infarct. Prominence of dilated perivascular spaces, likely from chronic hypertension. Few chronic hemorrhagic foci including along the high left frontal cortex, which may be posttraumatic as there is no visible infarct in this region. MRA HEAD FINDINGS Dominant right vertebral artery. No visible flow within the left V4 segment although the left PICA is seen arising from the distal left V4 segment, presumably from retrograde flow not detected by this technique. Right PICA is patent. Diminutive basilar artery with superimposed mild proximal narrowing. No visible intramural hematoma on conventional sequences. Focal high-grade left P2 segment atheromatous type narrowing. No downstream major branch occlusion is seen. Symmetric carotid arteries and branching. There is moderate atheromatous narrowing of the left anterior genu cavernous carotid. Scattered atheromatous narrowing and irregularity of ACA and MCA branches. Mild distal right M1 segment stenosis. No evidence of aneurysm or vascular malformation. IMPRESSION: No acute finding, including infarct. 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 Diffuse intracranial atherosclerotic change, primarily affecting medium size vessels. There is  notable chronic left V4 segment occlusion, moderate left cavernous stenosis, and high-grade proximal left PCA stenosis. Electronically Signed   By: Marnee Spring M.D.   On: 11/29/2015 16:04   Mr Brain Wo Contrast  Result Date: 11/29/2015 CLINICAL DATA:  Dementia. Questionable new onset of right facial droop and right leg weakness. EXAM: MRI HEAD WITHOUT CONTRAST MRA HEAD WITHOUT CONTRAST TECHNIQUE: Multiplanar, multiecho pulse sequences of the brain and surrounding structures were obtained without intravenous contrast. Angiographic images of the head were obtained using MRA technique without contrast. COMPARISON:  Head CT from earlier today FINDINGS: MRI HEAD FINDINGS Calvarium and upper cervical spine: No focal marrow signal abnormality. Orbits: Negative. Sinuses and Mastoids: Clear. Brain: No acute infarct, hemorrhage, hydrocephalus, or mass lesion. Abnormal appearance of the left V4 segment. There is chronic abnormal left vertebral artery Doppler waveform in the neck. Advanced chronic microvascular disease with ischemic gliosis confluent in the deep cerebral white matter and discrete lacunar infarcts in the bilateral thalamus and basal ganglia. Remote small to moderate left cerebellar infarct. Prominence of dilated perivascular spaces, likely from chronic hypertension. Few chronic hemorrhagic foci including along the high left frontal cortex, which may be posttraumatic as there is no visible infarct in this region. MRA HEAD FINDINGS Dominant right vertebral artery. No visible flow within the left V4 segment although the left PICA is seen arising from the distal left V4 segment, presumably from retrograde flow not detected by this technique. Right PICA is patent.  Diminutive basilar artery with superimposed mild proximal narrowing. No visible intramural hematoma on conventional sequences. Focal high-grade left P2 segment atheromatous type narrowing. No downstream major branch occlusion is seen. Symmetric  carotid arteries and branching. There is moderate atheromatous narrowing of the left anterior genu cavernous carotid. Scattered atheromatous narrowing and irregularity of ACA and MCA branches. Mild distal right M1 segment stenosis. No evidence of aneurysm or vascular malformation. IMPRESSION: No acute finding, including infarct. 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 Diffuse intracranial atherosclerotic change, primarily affecting medium size vessels. There is notable chronic left V4 segment occlusion, moderate left cavernous stenosis, and high-grade proximal left PCA stenosis. Electronically Signed   By: Marnee Spring M.D.   On: 11/29/2015 16:04   US Carotid Bilateral  Result Date: 11/29/2015 CLINICAL DATA:  CVA, syncopal episode and visual disturbance. History of hypertension and hyperlipidemia. EXAM: BILATERAL CAROTID DUPLEX ULTRASOUND TECHNIQUE: Wallace Cullens scale imaging, color Doppler and duplex ultrasound were performed of bilateral carotid and vertebral arteries in the neck. COMPARISON:  Carotid Doppler ultrasound - 06/26/2012 FINDINGS: Criteria: Quantification of carotid stenosis is based on velocity parameters that correlate the residual internal carotid diameter with NASCET-based stenosis levels, using the diameter of the distal internal carotid lumen as the denominator for stenosis measurement. The following velocity measurements were obtained: RIGHT ICA:  94/26 cm/sec CCA:  86/13 cm/sec SYSTOLIC ICA/CCA RATIO:  1.1 DIASTOLIC ICA/CCA RATIO:  2.0 ECA:  143 cm/sec LEFT ICA:  150/45 cm/sec CCA:  90/17 cm/sec SYSTOLIC ICA/CCA RATIO:  1.7 DIASTOLIC ICA/CCA RATIO:  2.6 ECA:  119 cm/sec RIGHT CAROTID ARTERY: There is a minimal amount of eccentric mixed echogenic plaque within the right carotid bulb (image 15 and 16). There is a moderate amount of eccentric mixed echogenic plaque involving the origin and proximal aspect the right internal carotid artery (image 23), not  resulting in elevated peak systolic velocities within the interrogated course the right internal carotid artery to suggest a hemodynamically significant stenosis. RIGHT VERTEBRAL ARTERY:  Antegrade Flow LEFT CAROTID ARTERY: There is a moderate amount of eccentric mixed echogenic plaque within the left carotid bulb (images 46 and 47). There is a moderate to large amount of eccentric mixed echogenic densely shadowing plaque involving the origin and proximal aspect the left internal carotid artery (image 54),, likely progressed since the 06/2012 examination and again results in elevated peak systolic velocities within the mid and distal aspects of the left internal carotid artery (greatest acquired peak systolic velocity within distal aspect the left internal carotid artery measures 150 cm/sec- image 63, previously, 155 cm/sec). LEFT VERTEBRAL ARTERY: Antegrade flow, though lack of antegrade diastolic flow was demonstrated. IMPRESSION: 1. Suspected progression of left-sided atherosclerotic plaque again resulting in elevated peak systolic velocities within the left internal carotid artery compatible with the 50-69% luminal narrowing range. Further evaluation with CTA could be performed as clinically indicated. 2. Grossly unchanged right-sided atherosclerotic plaque, not resulting in a hemodynamically significant stenosis. 3. Antegrade flow is demonstrated within the left vertebral artery however there is lack of antegrade diastolic flow which represents an interval change compared to the 06/2012 examination. Again, this could be further evaluated at the time of CTA as indicated. 4. Antegrade flow demonstrated within the right vertebral artery. Electronically Signed   By: Simonne Come M.D.   On: 11/29/2015 15:09    Medications:  I have reviewed the patient's current medications. Scheduled: . aspirin EC  81 mg Oral Daily  . atorvastatin  40 mg Oral q1800  .  clopidogrel  75 mg Oral Daily  . donepezil  5 mg Oral QHS   . enoxaparin (LOVENOX) injection  40 mg Subcutaneous Q24H  . escitalopram  20 mg Oral Daily  . finasteride  5 mg Oral Daily  . pneumococcal 23 valent vaccine  0.5 mL Intramuscular Tomorrow-1000    Assessment/Plan: Patient at baseline.  MRI of the brain personally reviewed and shows no acute changes.  Carotid dopplers show no evidence of hemodynamically significant stenosis on the right.  LICA 50-69%.  Echocardiogram shows no cardiac source of emboli with an EF of 65%.  A1c pending, LDL 118.  Patient on ASA and Plavix.  Suspect presenting event related to bradycardia and low BP.  Recommendations: 1.  Agree with medication adjustments.   2.  Would continue ASA and Plavix.  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   3.  No further neurological work up indicated on an inpatient basis.  Patient to continue follow up with Dr. Malvin JohnsPotter.   Case discussed with Dr. Renae GlossWieting    LOS: 1 day   Thana FarrLeslie Meggan Dhaliwal, MD Neurology [phone] 11/30/2015  12:07 PM

## 2015-11-30 NOTE — Care Management Important Message (Signed)
Important Message  Patient Details  Name: Koleen NimrodWoodroe Wernert MRN: [id number] Date of Birth: 1941-01-29   Medicare Important Message Given:  Yes    Gwenette GreetBrenda S Loveda Colaizzi, RN 11/30/2015, 8:42 AM

## 2015-11-30 NOTE — Discharge Summary (Signed)
[facility] - [place] [facility]   PATIENT NAME: Malik Dean    MR#:  [id number]  DATE OF BIRTH:  05-08-1940  DATE OF ADMISSION:  11/29/2015 ADMITTING PHYSICIAN: Auburn Bilberry, MD  DATE OF DISCHARGE: 11/30/2015  3:00 PM  PRIMARY CARE PHYSICIAN: Derwood Kaplan, MD    ADMISSION DIAGNOSIS:  CVA (cerebral infarction) [I63.9] Cerebral infarction due to unspecified mechanism [I63.9]  DISCHARGE DIAGNOSIS:  Orthostatic hypotension  SECONDARY DIAGNOSIS:   Past Medical History:  Diagnosis Date  . CAD (coronary artery disease)   . Diabetes mellitus without complication (HCC)   . Heart valve replaced   . Hypertension   . MI (myocardial infarction) (HCC)   . Rheumatic fever     HOSPITAL COURSE:   1. Hypotension and bradycardia. Patient had orthostatic hypotension while being here. Atenolol was held on presentation along with his lisinopril. I also stopped Flomax which can cause orthostatic hypotension. The patient was hydrated during the hospital course while here. His blood pressure did drop down quite a bit. I believe this is secondary to inactivity. The patient did okay with physical therapy here. I saw him walk to the bathroom with the aide and he did not complain of any dizziness while he was walking around. His blood pressure did drop down at home with physical therapy walking with him. I believe this is all secondary to orthostatic hypotension. I do not believe that this was a stroke. Decreased responsiveness has improved. TED hose prescribed. Patient must stay hydrated. 2. History of coronary artery disease on aspirin and Plavix. Stop beta blocker with bradycardia 3. Hyperlipidemia unspecified on atorvastatin 4. BPH stopped Flomax secondary to orthostatic hypotension and start Proscar instead. 5. Type 2 diabetes mellitus. Diet controlled   DISCHARGE CONDITIONS:   Fair  CONSULTS OBTAINED:  Treatment Team:  Kym Groom, MD  DRUG ALLERGIES:    Allergies  Allergen Reactions  . Amlodipine Other (See Comments)    dizziness    DISCHARGE MEDICATIONS:   Discharge Medication List as of 11/30/2015  2:19 PM    START taking these medications   Details  finasteride (PROSCAR) 5 MG tablet Take 1 tablet (5 mg total) by mouth daily., Starting Tue 11/30/2015, Print      CONTINUE these medications which have NOT CHANGED   Details  aspirin EC 81 MG tablet Take 1 tablet by mouth daily., Historical Med    atorvastatin (LIPITOR) 40 MG tablet Take 1 tablet by mouth daily at 6 PM. , Starting Mon 02/16/2014, Historical Med    clopidogrel (PLAVIX) 75 MG tablet Take 75 mg by mouth daily., Historical Med    donepezil (ARICEPT) 5 MG tablet Take 5 mg by mouth at bedtime., Historical Med    escitalopram (LEXAPRO) 20 MG tablet Take 20 mg by mouth daily., Historical Med    magnesium 30 MG tablet Take by mouth daily., Historical Med      STOP taking these medications     atenolol (TENORMIN) 50 MG tablet      lisinopril (PRINIVIL,ZESTRIL) 20 MG tablet      tamsulosin (FLOMAX) 0.4 MG CAPS capsule          DISCHARGE INSTRUCTIONS:   Follow-up with Dr. Maryellen Pile in 1 week  If you experience worsening of your admission symptoms, develop shortness of breath, life threatening emergency, suicidal or homicidal thoughts you must seek medical attention immediately by calling 911 or calling your MD immediately  if symptoms less severe.  You Must read complete instructions/literature  along with all the possible adverse reactions/side effects for all the Medicines you take and that have been prescribed to you. Take any new Medicines after you have completely understood and accept all the possible adverse reactions/side effects.   Please note  You were cared for by a hospitalist during your hospital stay. If you have any questions about your discharge medications or the care you received while you were in the hospital after you are discharged, you can call  the unit and asked to speak with the hospitalist on call if the hospitalist that took care of you is not available. Once you are discharged, your primary care physician will handle any further medical issues. Please note that NO REFILLS for any discharge medications will be authorized once you are discharged, as it is imperative that you return to your primary care physician (or establish a relationship with a primary care physician if you do not have one) for your aftercare needs so that they can reassess your need for medications and monitor your lab values.    Today   CHIEF COMPLAINT:   Chief Complaint  Patient presents with  . Weakness    HISTORY OF PRESENT ILLNESS:  Malik Dean  is a 75 y.o. male brought in with decreased responsiveness and weakness and found to be bradycardic and hypotensive at home.   VITAL SIGNS:  Blood pressure (!) 160/59, pulse (!) 55, temperature 98.1 F (36.7 C), temperature source Oral, resp. rate 17, height 6' (1.829 m), weight 128.1 kg (282 lb 8 oz), SpO2 96 %.    PHYSICAL EXAMINATION:  GENERAL:  75 y.o.-year-old patient lying in the bed with no acute distress.  EYES: Pupils equal, round, reactive to light and accommodation. No scleral icterus. Extraocular muscles intact.  HEENT: Head atraumatic, normocephalic. Oropharynx and nasopharynx clear.  NECK:  Supple, no jugular venous distention. No thyroid enlargement, no tenderness.  LUNGS: Normal breath sounds bilaterally, no wheezing, rales,rhonchi or crepitation. No use of accessory muscles of respiration.  CARDIOVASCULAR: S1, S2 normal. 2/6 systolic murmurs. No rubs, or gallops.  ABDOMEN: Soft, non-tender, non-distended. Bowel sounds present. No organomegaly or mass.  EXTREMITIES: 2+ pedal edema. No cyanosis, or clubbing.  NEUROLOGIC: Cranial nerves II through XII are intact. Muscle strength 5/5 in all extremities. Sensation intact. Gait not checked.  PSYCHIATRIC: The patient is alert and oriented x  3.  SKIN: No obvious rash, lesion, or ulcer.   DATA REVIEW:   CBC  Recent Labs Lab 11/30/15 0448  WBC 8.2  HGB 13.6  HCT 40.0  PLT 129*    Chemistries   Recent Labs Lab 11/29/15 1207 11/30/15 0448  NA 140 139  K 4.3 4.4  CL 104 106  CO2 30 25  GLUCOSE 118* 104*  BUN 26* 23*  CREATININE 1.09 1.03  CALCIUM 8.9 8.8*  AST 18  --   ALT 16*  --   ALKPHOS 72  --   BILITOT 1.1  --     Cardiac Enzymes  Recent Labs Lab 11/29/15 1207  TROPONINI <0.03     RADIOLOGY:  Ct Head Wo Contrast  Result Date: 11/29/2015 CLINICAL DATA:  Weakness. EXAM: CT HEAD WITHOUT CONTRAST TECHNIQUE: Contiguous axial images were obtained from the base of the skull through the vertex without intravenous contrast. COMPARISON:  06/27/2012.  05/24/2015. FINDINGS: There is no evidence for acute hemorrhage, hydrocephalus, mass lesion, or abnormal extra-axial fluid collection. No definite CT evidence for acute infarction. Diffuse loss of parenchymal volume is consistent with atrophy.  Patchy low attenuation in the deep hemispheric and periventricular white matter is nonspecific, but likely reflects chronic microvascular ischemic demyelination. 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 IMPRESSION: 1. Stable exam.  No acute intracranial abnormality 2. 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 Electronically Signed   By: Kennith CenterEric  Mansell M.D.   On: 11/29/2015 12:05   Mr Maxine GlennMra Head Wo Contrast  Result Date: 11/29/2015 CLINICAL DATA:  Dementia. Questionable new onset of right facial droop and right leg weakness. EXAM: MRI HEAD WITHOUT CONTRAST MRA HEAD WITHOUT CONTRAST TECHNIQUE: Multiplanar, multiecho pulse sequences of the brain and surrounding structures were obtained without intravenous contrast. Angiographic images of the head were obtained using MRA technique  without contrast. COMPARISON:  Head CT from earlier today FINDINGS: MRI HEAD FINDINGS Calvarium and upper cervical spine: No focal marrow signal abnormality. Orbits: Negative. Sinuses and Mastoids: Clear. Brain: No acute infarct, hemorrhage, hydrocephalus, or mass lesion. Abnormal appearance of the left V4 segment. There is chronic abnormal left vertebral artery Doppler waveform in the neck. Advanced chronic microvascular disease with ischemic gliosis confluent in the deep cerebral white matter and discrete lacunar infarcts in the bilateral thalamus and basal ganglia. Remote small to moderate left cerebellar infarct. Prominence of dilated perivascular spaces, likely from chronic hypertension. Few chronic hemorrhagic foci including along the high left frontal cortex, which may be posttraumatic as there is no visible infarct in this region. MRA HEAD FINDINGS Dominant right vertebral artery. No visible flow within the left V4 segment although the left PICA is seen arising from the distal left V4 segment, presumably from retrograde flow not detected by this technique. Right PICA is patent. Diminutive basilar artery with superimposed mild proximal narrowing. No visible intramural hematoma on conventional sequences. Focal high-grade left P2 segment atheromatous type narrowing. No downstream major branch occlusion is seen. Symmetric carotid arteries and branching. There is moderate atheromatous narrowing of the left anterior genu cavernous carotid. Scattered atheromatous narrowing and irregularity of ACA and MCA branches. Mild distal right M1 segment stenosis. No evidence of aneurysm or vascular malformation. IMPRESSION: No acute finding, including infarct. 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 Diffuse intracranial atherosclerotic change, primarily affecting medium size vessels. There is notable chronic left V4 segment occlusion, moderate left cavernous stenosis, and high-grade  proximal left PCA stenosis. Electronically Signed   By: Marnee SpringJonathon  Watts M.D.   On: 11/29/2015 16:04   Mr Brain Wo Contrast  Result Date: 11/29/2015 CLINICAL DATA:  Dementia. Questionable new onset of right facial droop and right leg weakness. EXAM: MRI HEAD WITHOUT CONTRAST MRA HEAD WITHOUT CONTRAST TECHNIQUE: Multiplanar, multiecho pulse sequences of the brain and surrounding structures were obtained without intravenous contrast. Angiographic images of the head were obtained using MRA technique without contrast. COMPARISON:  Head CT from earlier today FINDINGS: MRI HEAD FINDINGS Calvarium and upper cervical spine: No focal marrow signal abnormality. Orbits: Negative. Sinuses and Mastoids: Clear. Brain: No acute infarct, hemorrhage, hydrocephalus, or mass lesion. Abnormal appearance of the left V4 segment. There is chronic abnormal left vertebral artery Doppler waveform in the neck. Advanced chronic microvascular disease with ischemic gliosis confluent in the deep cerebral white matter and discrete lacunar infarcts in the bilateral thalamus and basal ganglia. Remote small to moderate left cerebellar infarct. Prominence of dilated perivascular spaces, likely from chronic hypertension. Few chronic hemorrhagic foci including along the high left frontal cortex, which may  be posttraumatic as there is no visible infarct in this region. MRA HEAD FINDINGS Dominant right vertebral artery. No visible flow within the left V4 segment although the left PICA is seen arising from the distal left V4 segment, presumably from retrograde flow not detected by this technique. Right PICA is patent. Diminutive basilar artery with superimposed mild proximal narrowing. No visible intramural hematoma on conventional sequences. Focal high-grade left P2 segment atheromatous type narrowing. No downstream major branch occlusion is seen. Symmetric carotid arteries and branching. There is moderate atheromatous narrowing of the left anterior  genu cavernous carotid. Scattered atheromatous narrowing and irregularity of ACA and MCA branches. Mild distal right M1 segment stenosis. No evidence of aneurysm or vascular malformation. IMPRESSION: No acute finding, including infarct. 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 Diffuse intracranial atherosclerotic change, primarily affecting medium size vessels. There is notable chronic left V4 segment occlusion, moderate left cavernous stenosis, and high-grade proximal left PCA stenosis. Electronically Signed   By: Marnee Spring M.D.   On: 11/29/2015 16:04   US Carotid Bilateral  Result Date: 11/29/2015 CLINICAL DATA:  CVA, syncopal episode and visual disturbance. History of hypertension and hyperlipidemia. EXAM: BILATERAL CAROTID DUPLEX ULTRASOUND TECHNIQUE: Wallace Cullens scale imaging, color Doppler and duplex ultrasound were performed of bilateral carotid and vertebral arteries in the neck. COMPARISON:  Carotid Doppler ultrasound - 06/26/2012 FINDINGS: Criteria: Quantification of carotid stenosis is based on velocity parameters that correlate the residual internal carotid diameter with NASCET-based stenosis levels, using the diameter of the distal internal carotid lumen as the denominator for stenosis measurement. The following velocity measurements were obtained: RIGHT ICA:  94/26 cm/sec CCA:  86/13 cm/sec SYSTOLIC ICA/CCA RATIO:  1.1 DIASTOLIC ICA/CCA RATIO:  2.0 ECA:  143 cm/sec LEFT ICA:  150/45 cm/sec CCA:  90/17 cm/sec SYSTOLIC ICA/CCA RATIO:  1.7 DIASTOLIC ICA/CCA RATIO:  2.6 ECA:  119 cm/sec RIGHT CAROTID ARTERY: There is a minimal amount of eccentric mixed echogenic plaque within the right carotid bulb (image 15 and 16). There is a moderate amount of eccentric mixed echogenic plaque involving the origin and proximal aspect the right internal carotid artery (image 23), not resulting in elevated peak systolic velocities within the interrogated course the right internal  carotid artery to suggest a hemodynamically significant stenosis. RIGHT VERTEBRAL ARTERY:  Antegrade Flow LEFT CAROTID ARTERY: There is a moderate amount of eccentric mixed echogenic plaque within the left carotid bulb (images 46 and 47). There is a moderate to large amount of eccentric mixed echogenic densely shadowing plaque involving the origin and proximal aspect the left internal carotid artery (image 54),, likely progressed since the 06/2012 examination and again results in elevated peak systolic velocities within the mid and distal aspects of the left internal carotid artery (greatest acquired peak systolic velocity within distal aspect the left internal carotid artery measures 150 cm/sec- image 63, previously, 155 cm/sec). LEFT VERTEBRAL ARTERY: Antegrade flow, though lack of antegrade diastolic flow was demonstrated. IMPRESSION: 1. Suspected progression of left-sided atherosclerotic plaque again resulting in elevated peak systolic velocities within the left internal carotid artery compatible with the 50-69% luminal narrowing range. Further evaluation with CTA could be performed as clinically indicated. 2. Grossly unchanged right-sided atherosclerotic plaque, not resulting in a hemodynamically significant stenosis. 3. Antegrade flow is demonstrated within the left vertebral artery however there is lack of antegrade diastolic flow which represents an interval change compared to the 06/2012 examination. Again, this could be further evaluated at the time of CTA as indicated. 4. Antegrade  flow demonstrated within the right vertebral artery. Electronically Signed   By: Simonne Come M.D.   On: 11/29/2015 15:09    Management plans discussed with the patient, family and they are in agreement.  CODE STATUS:     Code Status Orders        Start     Ordered   11/29/15 1400  Full code  Continuous     11/29/15 1400    Code Status History    Date Active Date Inactive Code Status Order ID Comments User  Context   11/29/2015  2:00 PM 11/29/2015  5:20 PM Full Code [id number]  Auburn Bilberry, MD ED      TOTAL TIME TAKING CARE OF THIS PATIENT: 35 minutes.    Alford Highland M.D on 11/30/2015 at 5:16 PM  Between 7am to 6pm - Pager - [phone]  After 6pm go to [email or web address] - password [email or web address]  [facility] Office  [phone]  CC: Primary care physician; Derwood Kaplan, MD

## 2016-05-02 ENCOUNTER — Encounter: Payer: Self-pay | Admitting: *Deleted

## 2016-05-02 ENCOUNTER — Observation Stay
Admission: EM | Admit: 2016-05-02 | Discharge: 2016-05-04 | Disposition: A | Discharge status: Home / Self Care | Payer: Medicare Other | Attending: Internal Medicine | Admitting: Internal Medicine

## 2016-05-02 ENCOUNTER — Emergency Department: Payer: Medicare Other

## 2016-05-02 ENCOUNTER — Observation Stay: Payer: Medicare Other

## 2016-05-02 DIAGNOSIS — I2510 Atherosclerotic heart disease of native coronary artery without angina pectoris: Secondary | ICD-10-CM | POA: Diagnosis not present

## 2016-05-02 DIAGNOSIS — R262 Difficulty in walking, not elsewhere classified: Secondary | ICD-10-CM | POA: Diagnosis not present

## 2016-05-02 DIAGNOSIS — E860 Dehydration: Secondary | ICD-10-CM | POA: Insufficient documentation

## 2016-05-02 DIAGNOSIS — R001 Bradycardia, unspecified: Secondary | ICD-10-CM | POA: Diagnosis present

## 2016-05-02 DIAGNOSIS — Z952 Presence of prosthetic heart valve: Secondary | ICD-10-CM | POA: Insufficient documentation

## 2016-05-02 DIAGNOSIS — Z7902 Long term (current) use of antithrombotics/antiplatelets: Secondary | ICD-10-CM | POA: Diagnosis not present

## 2016-05-02 DIAGNOSIS — E119 Type 2 diabetes mellitus without complications: Secondary | ICD-10-CM | POA: Insufficient documentation

## 2016-05-02 DIAGNOSIS — I10 Essential (primary) hypertension: Secondary | ICD-10-CM | POA: Diagnosis not present

## 2016-05-02 DIAGNOSIS — N400 Enlarged prostate without lower urinary tract symptoms: Secondary | ICD-10-CM | POA: Diagnosis not present

## 2016-05-02 DIAGNOSIS — F0390 Unspecified dementia without behavioral disturbance: Secondary | ICD-10-CM | POA: Insufficient documentation

## 2016-05-02 DIAGNOSIS — R531 Weakness: Secondary | ICD-10-CM

## 2016-05-02 DIAGNOSIS — Z79899 Other long term (current) drug therapy: Secondary | ICD-10-CM | POA: Insufficient documentation

## 2016-05-02 DIAGNOSIS — M6281 Muscle weakness (generalized): Secondary | ICD-10-CM

## 2016-05-02 DIAGNOSIS — N170 Acute kidney failure with tubular necrosis: Secondary | ICD-10-CM | POA: Insufficient documentation

## 2016-05-02 DIAGNOSIS — I959 Hypotension, unspecified: Secondary | ICD-10-CM | POA: Diagnosis not present

## 2016-05-02 DIAGNOSIS — N179 Acute kidney failure, unspecified: Secondary | ICD-10-CM

## 2016-05-02 DIAGNOSIS — R55 Syncope and collapse: Secondary | ICD-10-CM | POA: Diagnosis not present

## 2016-05-02 DIAGNOSIS — E785 Hyperlipidemia, unspecified: Secondary | ICD-10-CM | POA: Diagnosis not present

## 2016-05-02 DIAGNOSIS — Z951 Presence of aortocoronary bypass graft: Secondary | ICD-10-CM | POA: Diagnosis not present

## 2016-05-02 DIAGNOSIS — I252 Old myocardial infarction: Secondary | ICD-10-CM | POA: Diagnosis not present

## 2016-05-02 DIAGNOSIS — Z7982 Long term (current) use of aspirin: Secondary | ICD-10-CM | POA: Insufficient documentation

## 2016-05-02 LAB — COMPREHENSIVE METABOLIC PANEL
ALBUMIN: 3.1 g/dL — AB (ref 3.5–5.0)
ALK PHOS: 68 U/L (ref 38–126)
ALT: 18 U/L (ref 17–63)
AST: 22 U/L (ref 15–41)
Anion gap: 8 (ref 5–15)
BUN: 29 mg/dL — AB (ref 6–20)
CHLORIDE: 104 mmol/L (ref 101–111)
CO2: 27 mmol/L (ref 22–32)
CREATININE: 1.52 mg/dL — AB (ref 0.61–1.24)
Calcium: 8.8 mg/dL — ABNORMAL LOW (ref 8.9–10.3)
GFR calc Af Amer: 50 mL/min — ABNORMAL LOW (ref >60)
GFR calc non Af Amer: 43 mL/min — ABNORMAL LOW (ref >60)
GLUCOSE: 126 mg/dL — AB (ref 65–99)
Potassium: 3.9 mmol/L (ref 3.5–5.1)
SODIUM: 139 mmol/L (ref 135–145)
Total Bilirubin: 0.7 mg/dL (ref 0.3–1.2)
Total Protein: 6 g/dL — ABNORMAL LOW (ref 6.5–8.1)

## 2016-05-02 LAB — CBC WITH DIFFERENTIAL/PLATELET
BASOS ABS: 0 10*3/uL (ref 0–0.1)
Basophils Relative: 0 %
EOS PCT: 1 %
Eosinophils Absolute: 0.1 10*3/uL (ref 0–0.7)
HEMATOCRIT: 39.1 % — AB (ref 40.0–52.0)
Hemoglobin: 12.9 g/dL — ABNORMAL LOW (ref 13.0–18.0)
LYMPHS ABS: 1.3 10*3/uL (ref 1.0–3.6)
LYMPHS PCT: 16 %
MCH: 27.6 pg (ref 26.0–34.0)
MCHC: 32.9 g/dL (ref 32.0–36.0)
MCV: 84.1 fL (ref 80.0–100.0)
MONO ABS: 0.6 10*3/uL (ref 0.2–1.0)
Monocytes Relative: 7 %
NEUTROS ABS: 6.4 10*3/uL (ref 1.4–6.5)
Neutrophils Relative %: 76 %
PLATELETS: 168 10*3/uL (ref 150–440)
RBC: 4.65 MIL/uL (ref 4.40–5.90)
RDW: 15.2 % — ABNORMAL HIGH (ref 11.5–14.5)
WBC: 8.4 10*3/uL (ref 3.8–10.6)

## 2016-05-02 LAB — ETHANOL: Alcohol, Ethyl (B): <5 mg/dL (ref <5)

## 2016-05-02 LAB — TROPONIN I: Troponin I: <0.03 ng/mL (ref <0.03)

## 2016-05-02 MED ORDER — INFLUENZA VAC SPLIT QUAD 0.5 ML IM SUSY
0.5000 mL | PREFILLED_SYRINGE | INTRAMUSCULAR | Status: AC
  Administered 2016-05-03: 0.5 mL via INTRAMUSCULAR
  Filled 2016-05-02: qty 0.5

## 2016-05-02 MED ORDER — CLOPIDOGREL BISULFATE 75 MG PO TABS
75.0000 mg | ORAL_TABLET | Freq: Every day | ORAL | Status: DC
  Administered 2016-05-03 – 2016-05-04 (×2): 75 mg via ORAL
  Filled 2016-05-02 (×2): qty 1

## 2016-05-02 MED ORDER — SODIUM CHLORIDE 0.9 % IV BOLUS (SEPSIS)
500.0000 mL | Freq: Once | INTRAVENOUS | Status: AC
  Administered 2016-05-02: 500 mL via INTRAVENOUS

## 2016-05-02 MED ORDER — ACETAMINOPHEN 325 MG PO TABS: 650.0000 mg | ORAL_TABLET | Freq: Four times a day (QID) | ORAL | Status: DC | PRN

## 2016-05-02 MED ORDER — ENOXAPARIN SODIUM 40 MG/0.4ML ~~LOC~~ SOLN
40.0000 mg | SUBCUTANEOUS | Status: DC
  Administered 2016-05-02 – 2016-05-03 (×2): 40 mg via SUBCUTANEOUS
  Filled 2016-05-02 (×2): qty 0.4

## 2016-05-02 MED ORDER — ACETAMINOPHEN 650 MG RE SUPP: 650.0000 mg | Freq: Four times a day (QID) | RECTAL | Status: DC | PRN

## 2016-05-02 MED ORDER — DOXAZOSIN MESYLATE 1 MG PO TABS
1.0000 mg | ORAL_TABLET | Freq: Every day | ORAL | Status: DC
  Administered 2016-05-03 – 2016-05-04 (×2): 1 mg via ORAL
  Filled 2016-05-02 (×2): qty 1

## 2016-05-02 MED ORDER — ESCITALOPRAM OXALATE 10 MG PO TABS
20.0000 mg | ORAL_TABLET | Freq: Every day | ORAL | Status: DC
  Administered 2016-05-03 – 2016-05-04 (×2): 20 mg via ORAL
  Filled 2016-05-02 (×2): qty 2

## 2016-05-02 MED ORDER — ATORVASTATIN CALCIUM 20 MG PO TABS
40.0000 mg | ORAL_TABLET | Freq: Every day | ORAL | Status: DC
  Administered 2016-05-02 – 2016-05-03 (×2): 40 mg via ORAL
  Filled 2016-05-02 (×3): qty 2

## 2016-05-02 MED ORDER — ASPIRIN EC 81 MG PO TBEC
81.0000 mg | DELAYED_RELEASE_TABLET | Freq: Every day | ORAL | Status: DC
  Administered 2016-05-02 – 2016-05-04 (×3): 81 mg via ORAL
  Filled 2016-05-02 (×2): qty 1

## 2016-05-02 MED ORDER — DONEPEZIL HCL 5 MG PO TABS
10.0000 mg | ORAL_TABLET | Freq: Every day | ORAL | Status: DC
  Administered 2016-05-02 – 2016-05-03 (×2): 10 mg via ORAL
  Filled 2016-05-02 (×2): qty 2

## 2016-05-02 MED ORDER — FINASTERIDE 5 MG PO TABS
5.0000 mg | ORAL_TABLET | Freq: Every day | ORAL | Status: DC
  Administered 2016-05-03 – 2016-05-04 (×2): 5 mg via ORAL
  Filled 2016-05-02 (×2): qty 1

## 2016-05-02 MED ORDER — ASPIRIN EC 81 MG PO TBEC
DELAYED_RELEASE_TABLET | ORAL | Status: AC
  Administered 2016-05-02: 81 mg via ORAL
  Filled 2016-05-02: qty 1

## 2016-05-02 MED ORDER — SODIUM CHLORIDE 0.9 % IV SOLN
INTRAVENOUS | Status: DC
  Administered 2016-05-02: 19:00:00 via INTRAVENOUS

## 2016-05-02 NOTE — ED Triage Notes (Signed)
PT arrived via EMS from home after wife reports pt was having multiple syncopal episodes. Event reportedly lasted 30 minutes and is not the first time pt has experienced an event like this. EMS reports pt was 80/40 with a HR of 40 upon their arrival. 300 cc NS administered and EMS reports pts HR increased to 70 bpm and BP of 129/70. PT has no memory of this event. Pt is A&O x 4.   Hx) stroke, syncope, urinary retention and vascular dementia

## 2016-05-02 NOTE — Consult Note (Signed)
[facility] CLINIC CARDIOLOGY [facility]  CARDIOLOGY CONSULT NOTE  Patient ID: Malik Dean MRN: [id number] DOB/AGE: 1940-09-06 76 y.o.  Admit date: 05/02/2016 Referring Physician Dr. Hilton Sinclair Primary Physician Dr. Maryellen Pile Primary Cardiologist Dr. Juliann Pares Reason for Consultation bradycardia  HPI: Pt is a 76 yo male with history of cad, severe dementia, hypertension, aortic stenosis s/p avr with a bioprosthetic aortic valve and cabg who was brought to er because his wife stated he was very sleepy. There was apparently on syncope. He was noted to be hypotensive and dehydrated. Heart rate was in the 30's and 40's.EKG showed sinus bradycardia.  Marland Kitchen Has been taken off of amiodarone as an outpatient due to bradycardia and has not been felt to be a candidate for chronic anticoagulation. He was apparently on asa, plavix, cardura and finaasteride as an outpatient.  Creatinine was increase to 1.52 up from baseline of 1.03 with gfr of 50. Pt currently denies chest pain. He is not aware if he is taking his meds as orderred or not.   Review of Systems  Unable to perform ROS: Dementia    Past Medical History:  Diagnosis Date  . CAD (coronary artery disease)   . Diabetes mellitus without complication (HCC)   . Heart valve replaced   . Hypertension   . MI (myocardial infarction)   . Rheumatic fever     Family History  Problem Relation Age of Onset  . Hypertension Father   . CAD Mother     Social History   Social History  . Marital status: Married    Spouse name: N/A  . Number of children: N/A  . Years of education: N/A   Occupational History  . Not on file.   Social History Main Topics  . Smoking status: Never Smoker  . Smokeless tobacco: Never Used  . Alcohol use No  . Drug use: Unknown  . Sexual activity: Not on file   Other Topics Concern  . Not on file   Social History Narrative  . No narrative on file    Past Surgical History:  Procedure Laterality Date  .  CORONARY ARTERY BYPASS GRAFT    . ORIF ANKLE FRACTURE    . WRIST SURGERY        (Not in a hospital admission)  Physical Exam: Blood pressure (!) 107/50, pulse (!) 41, temperature 97.7 F (36.5 C), temperature source Oral, resp. rate 14, weight 282 lb (127.9 kg), SpO2 93 %.   Wt Readings from Last 1 Encounters:  05/02/16 282 lb (127.9 kg)     General appearance: cooperative Resp: clear to auscultation bilaterally Chest wall: no tenderness Cardio: regular rate and rhythm GI: soft, non-tender; bowel sounds normal; no masses,  no organomegaly Extremities: extremities normal, atraumatic, no cyanosis or edema Neurologic: Mental status: orientation: person, place  Labs:   Lab Results  Component Value Date   WBC 8.4 05/02/2016   HGB 12.9 (L) 05/02/2016   HCT 39.1 (L) 05/02/2016   MCV 84.1 05/02/2016   PLT 168 05/02/2016    Recent Labs Lab 05/02/16 1426  NA 139  K 3.9  CL 104  CO2 27  BUN 29*  CREATININE 1.52*  CALCIUM 8.8*  PROT 6.0*  BILITOT 0.7  ALKPHOS 68  ALT 18  AST 22  GLUCOSE 126*   Lab Results  Component Value Date   CKTOTAL 27 (L) 06/27/2012   CKMB < 0.5 (L) 06/27/2012   TROPONINI <0.03 05/02/2016      Radiology:  Head  CT-No acute intracranial process. Stable examination: Multiple old infarcts, RIGHT inferior frontal lobe encephalomalacia most compatible with old trauma, moderate to severe chronic small vessel ischemic disease. Chest xray-no acute cardiopulmonary process.  EKG: sinus bradycardia  ASSESSMENT AND PLAN:    76 yo male with history of cad, severe dementia, hypertension, aortic stenosis s/p avr with a bioprosthetic aortic valve and cabg who was brought to er because his wife stated he was very sleepy. There was apparently on syncope. He was noted to be hypotensive and dehydrated. Heart rate was in the 30's and 40's.EKG showed sinus bradycardia. Heart rate is improved somewhat to the mid 50's. Will continue to hold av nodal drugs and gently  hydrate. Does not appear ischemic at present. Will follow heart rate . Consider ppm only if symtpomtaic bradycardia ocurs.  Signed: Dalia HeadingKenneth A Noble Cicalese MD, [facility] Surgery CenterFACC 05/02/2016, 4:19 PM

## 2016-05-02 NOTE — ED Notes (Signed)
Patient transported to CT. Pt attempted before transfer to urinate in urinal. No success.

## 2016-05-02 NOTE — ED Notes (Signed)
Attempted to call report

## 2016-05-02 NOTE — ED Provider Notes (Addendum)
[facility] Emergency Department Provider Note  ____________________________________________   I have reviewed the triage vital signs and the nursing notes.   HISTORY  Chief Complaint Hypotension and Loss of Consciousness    HPI Malik Dean is a 76 y.o. male who was admitted in August for a very subtle presentation of syncope and low blood pressure and bradycardia presents today with the same. According to EMS, patient himself cannot give a history, the patient had multiple episodes of "passing out" without any known trauma over the course of a half hour. When EMS arrived blood pressure was in the 80s heart rate was in the 40s, they gave IV fluid and now he has come back to pressure in the 120s and heart rate in the 70s. Patient does have no clear recollection of this. He has a history of dementia. He is at his baseline. He denies any pain or complaints of any variety and he states he feels "okay".   Level 5 chart caveat; no further history available due to patient status.   Past Medical History:  Diagnosis Date  . CAD (coronary artery disease)   . Diabetes mellitus without complication (HCC)   . Heart valve replaced   . Hypertension   . MI (myocardial infarction)   . Rheumatic fever     Patient Active Problem List   Diagnosis Date Noted  . CVA (cerebral infarction) 11/29/2015    Past Surgical History:  Procedure Laterality Date  . CORONARY ARTERY BYPASS GRAFT    . ORIF ANKLE FRACTURE    . WRIST SURGERY      Prior to Admission medications   Medication Sig Start Date End Date Taking? Authorizing Provider  aspirin EC 81 MG tablet Take 1 tablet by mouth daily.    Historical Provider, MD  atorvastatin (LIPITOR) 40 MG tablet Take 1 tablet by mouth daily at 6 PM.  02/16/14   Historical Provider, MD  clopidogrel (PLAVIX) 75 MG tablet Take 75 mg by mouth daily.    Historical Provider, MD  donepezil (ARICEPT) 5 MG tablet Take 5 mg by mouth at bedtime.     Historical Provider, MD  escitalopram (LEXAPRO) 20 MG tablet Take 20 mg by mouth daily.    Historical Provider, MD  finasteride (PROSCAR) 5 MG tablet Take 1 tablet (5 mg total) by mouth daily. 11/30/15   Alford Highland, MD  magnesium 30 MG tablet Take by mouth daily.    Historical Provider, MD    Allergies Amlodipine  Family History  Problem Relation Age of Onset  . Hypertension Father     Social History Social History  Substance Use Topics  . Smoking status: Never Smoker  . Smokeless tobacco: Never Used  . Alcohol use No    Review of Systems  Level 5 chart caveat; no further history available due to patient status. ____________________________________________   PHYSICAL EXAM:  VITAL SIGNS: ED Triage Vitals  Enc Vitals Group     BP 05/02/16 1422 (!) 111/53     Pulse Rate 05/02/16 1422 (!) 39     Resp 05/02/16 1422 18     Temp 05/02/16 1422 97.7 F (36.5 C)     Temp Source 05/02/16 1422 Oral     SpO2 05/02/16 1422 96 %     Weight 05/02/16 1423 282 lb (127.9 kg)     Height --      Head Circumference --      Peak Flow --      Pain Score --  Pain Loc --      Pain Edu? --      Excl. in GC? --     Constitutional: Alert and oriented To name unsure of date or place Well appearing and in no acute distress. Eyes: Conjunctivae are normal. PERRL. EOMI. Head: Atraumatic. Nose: No congestion/rhinnorhea. Mouth/Throat: Mucous membranes are moist.  Oropharynx non-erythematous. Neck: No stridor.   Nontender with no meningismus Cardiovascular: Bradycardia noted regular rhythm. Grossly normal heart sounds.  Good peripheral circulation. Respiratory: Normal respiratory effort.  No retractions. Lungs CTAB. Abdominal: Soft and nontender. No distention. No guarding no rebound Back:  There is no focal tenderness or step off.  there is no midline tenderness there are no lesions noted. there is no CVA tenderness Musculoskeletal: No lower extremity tenderness, no upper extremity  tenderness. No joint effusions, no DVT signs strong distal pulses no edema Neurologic:  Normal speech and language. No gross focal neurologic deficits are appreciated.  Skin:  Skin is warm, dry and intact. No rash noted. Psychiatric: Mood and affect are normal. Speech and behavior are normal.  ____________________________________________   LABS (all labs ordered are listed, but only abnormal results are displayed)  Labs Reviewed  URINE CULTURE  COMPREHENSIVE METABOLIC PANEL  ETHANOL  TROPONIN I  CBC WITH DIFFERENTIAL/PLATELET  URINALYSIS, COMPLETE (UACMP) WITH MICROSCOPIC   ____________________________________________  EKG  I personally interpreted any EKGs ordered by me or triage Appears to be sinus bradycardia rate 39 bpm, non-specific ST changes no acute ST elevation normal axis ____________________________________________  RADIOLOGY  I reviewed any imaging ordered by me or triage that were performed during my shift and, if possible, patient and/or family made aware of any abnormal findings. ____________________________________________   PROCEDURES  Procedure(s) performed: None  Procedures  Critical Care performed: CRITICAL CARE Performed by: Jeanmarie Plant   Total critical care time: 39 minutes  Critical care time was exclusive of separately billable procedures and treating other patients.  Critical care was necessary to treat or prevent imminent or life-threatening deterioration.  Critical care was time spent personally by me on the following activities: development of treatment plan with patient and/or surrogate as well as nursing, discussions with consultants, evaluation of patient's response to treatment, examination of patient, obtaining history from patient or surrogate, ordering and performing treatments and interventions, ordering and review of laboratory studies, ordering and review of radiographic studies, pulse oximetry and re-evaluation of patient's  condition.   ____________________________________________   INITIAL IMPRESSION / ASSESSMENT AND PLAN / ED COURSE  Pertinent labs & imaging results that were available during my care of the patient were reviewed by me and considered in my medical decision making (see chart for details).  She does not appear to be in acute distress although is bradycardia persists here. A very similar presentation to this last summer which seemed to resolve on its on. At that time he did stop his beta blockers. According to his MAR he is not currently taking beta blockers we'll ask his wife when she arrives. Given that his pressure is in the 110s with this heart rate we will not institute emergent correction however we'll watch him closely give him IV fluids and reassess.   ----------------------------------------- 3:41 PM on 05/02/2016 -----------------------------------------  Patient patient now at his baseline still to the extent that we can determine before. He is in no distress. Heart rate still in the mid 40s blood pressures have been holding well. This is very similar to prior visit. He does have some evidence of  dehydration we are giving him IV fluids discussed with the hospitalist agreed management we'll fit  Clinical Course    ____________________________________________   FINAL CLINICAL IMPRESSION(S) / ED DIAGNOSES  Final diagnoses:  None      This chart was dictated using voice recognition software.  Despite best efforts to proofread,  errors can occur which can change meaning.      Jeanmarie PlantJames A Jumanah Hynson, MD 05/02/16 1426    Jeanmarie PlantJames A Rex Oesterle, MD 05/02/16 [phone]

## 2016-05-02 NOTE — H&P (Signed)
[facility] [place] at [facility]   PATIENT NAME: Malik Dean    MR#:  [id number]  DATE OF BIRTH:  Feb 19, 1941  DATE OF ADMISSION:  05/02/2016  PRIMARY CARE PHYSICIAN: Derwood Kaplan, MD   REQUESTING/REFERRING PHYSICIAN: DR Ileana Roup  CHIEF COMPLAINT:   Chief Complaint  Patient presents with  . Hypotension  . Loss of Consciousness    HISTORY OF PRESENT ILLNESS:  Malik Dean  is a 76 y.o. male with a known history of Dementia and unable to give much history. I called his wife on the phone and she states that they tried to get him up and he was very sleepy and just wanted to lie down even when he was in the bathroom. He did not pass out. The patient states he feels fine. He offers no complaints. In the ER his initial blood pressure was found to be low and he was found to be dehydrated and his heart rate was in the 30s and 40s. At home it looks like he takes Cardizem CD.  PAST MEDICAL HISTORY:   Past Medical History:  Diagnosis Date  . CAD (coronary artery disease)   . Diabetes mellitus without complication (HCC)   . Heart valve replaced   . Hypertension   . MI (myocardial infarction)   . Rheumatic fever     PAST SURGICAL HISTORY:   Past Surgical History:  Procedure Laterality Date  . CORONARY ARTERY BYPASS GRAFT    . ORIF ANKLE FRACTURE    . WRIST SURGERY      SOCIAL HISTORY:   Social History  Substance Use Topics  . Smoking status: Never Smoker  . Smokeless tobacco: Never Used  . Alcohol use No    FAMILY HISTORY:   Family History  Problem Relation Age of Onset  . Hypertension Father   . CAD Mother     DRUG ALLERGIES:   Allergies  Allergen Reactions  . Amlodipine Other (See Comments)    dizziness    REVIEW OF SYSTEMS:  CONSTITUTIONAL: No fever. Positive for fatigue EYES: No blurred or double vision. Wears glasses EARS, NOSE, AND THROAT: No tinnitus or ear pain. No sore throat. Decreased hearing RESPIRATORY:  Occasional cough and shortness of breath. No wheezing or hemoptysis.  CARDIOVASCULAR: No chest pain, orthopnea, edema.  GASTROINTESTINAL: No nausea, vomiting, or abdominal pain. No blood in bowel movements. As per patient some diarrhea GENITOURINARY: No dysuria, hematuria.  ENDOCRINE: No polyuria, nocturia,  HEMATOLOGY: No anemia, easy bruising or bleeding SKIN: No rash or lesion. MUSCULOSKELETAL: No joint pain or arthritis.   NEUROLOGIC: No tingling, numbness, weakness.  PSYCHIATRY: No anxiety or depression.   MEDICATIONS AT HOME:   Prior to Admission medications   Medication Sig Start Date End Date Taking? Authorizing Provider  aspirin EC 81 MG tablet Take 1 tablet by mouth daily.   Yes Historical Provider, MD  atorvastatin (LIPITOR) 40 MG tablet Take 1 tablet by mouth daily at 6 PM.  02/16/14  Yes Historical Provider, MD  clopidogrel (PLAVIX) 75 MG tablet Take 75 mg by mouth daily.   Yes Historical Provider, MD  diltiazem (CARDIZEM CD) 240 MG 24 hr capsule Take 240 mg by mouth daily.   Yes Historical Provider, MD  donepezil (ARICEPT) 10 MG tablet Take 10 mg by mouth at bedtime.    Yes Historical Provider, MD  doxazosin (CARDURA) 1 MG tablet Take 1 mg by mouth daily.   Yes Historical Provider, MD  escitalopram (LEXAPRO) 20 MG  tablet Take 20 mg by mouth daily.   Yes Historical Provider, MD  finasteride (PROSCAR) 5 MG tablet Take 1 tablet (5 mg total) by mouth daily. 11/30/15  Yes Alford Highlandichard Joeleen Wortley, MD  magnesium 30 MG tablet Take by mouth daily.   Yes Historical Provider, MD      VITAL SIGNS:  Blood pressure (!) 107/50, pulse (!) 41, temperature 97.7 F (36.5 C), temperature source Oral, resp. rate 14, weight 127.9 kg (282 lb), SpO2 93 %.  PHYSICAL EXAMINATION:  GENERAL:  76 y.o.-year-old patient lying in the bed with no acute distress.  EYES: Pupils equal, round, reactive to light and accommodation. No scleral icterus. Extraocular muscles intact.  HEENT: Head atraumatic,  normocephalic. Oropharynx and nasopharynx clear.  NECK:  Supple, no jugular venous distention. No thyroid enlargement, no tenderness.  LUNGS: Normal breath sounds bilaterally, no wheezing, rales,rhonchi or crepitation. No use of accessory muscles of respiration.  CARDIOVASCULAR: S1, S2 Bradycardia. No murmurs, rubs, or gallops.  ABDOMEN: Soft, nontender, nondistended. Bowel sounds present. No organomegaly or mass.  EXTREMITIES:   trace edema,  no cyanosis, or clubbing.  NEUROLOGIC: Cranial nerves II through XII are intact. Muscle strength 5/5 in all extremities. Sensation intact. Gait not checked.  PSYCHIATRIC: The patient is alert and oriented x 3.  SKIN: No rash, lesion, or ulcer.   LABORATORY PANEL:   CBC  Recent Labs Lab 05/02/16 1446  WBC 8.4  HGB 12.9*  HCT 39.1*  PLT 168   ------------------------------------------------------------------------------------------------------------------  Chemistries   Recent Labs Lab 05/02/16 1426  NA 139  K 3.9  CL 104  CO2 27  GLUCOSE 126*  BUN 29*  CREATININE 1.52*  CALCIUM 8.8*  AST 22  ALT 18  ALKPHOS 68  BILITOT 0.7   ------------------------------------------------------------------------------------------------------------------  Cardiac Enzymes  Recent Labs Lab 05/02/16 1426  TROPONINI <0.03   ------------------------------------------------------------------------------------------------------------------  RADIOLOGY:  Dg Chest 2 View  Result Date: 05/02/2016 CLINICAL DATA:  Syncopal episode today. EXAM: CHEST  2 VIEW COMPARISON:  06/26/2012 FINDINGS: The cardiac silhouette remains mildly enlarged. Sequelae of prior cardiac valve replacement are again identified. Aortic atherosclerosis is noted. The lungs are mildly hypoinflated without evidence of airspace consolidation, edema, pleural effusion, or pneumothorax. No acute osseous abnormality is seen. IMPRESSION: 1. No evidence of acute cardiopulmonary process. 2.  Aortic atherosclerosis. Electronically Signed   By: Sebastian AcheAllen  Grady M.D.   On: 05/02/2016 15:43    EKG:   Sinus bradycardia   IMPRESSION AND PLAN:   1. Near syncope, dehydration, bradycardia. Gentle IV fluid hydration and IV fluid bolus. Check orthostatic vital signs. Hold Cardizem CD. Cardiology consultation. Monitor on telemetry. Physical therapy evaluation. Check urinalysis. 2. History of coronary artery disease on aspirin and Plavix  3. Dementia without behavioral disturbance hearing continue Aricept and Lexapro.  4. Weakness physical therapy evaluation 5. Hyperlipidemia unspecified on atorvastatin 6. BPH on Cardura   All the records are reviewed and case discussed with ED provider. Management plans discussed with the patient, family and they are in agreement.  CODE STATUS: FULL CODE   TOTAL TIME TAKING CARE OF THIS PATIENT: 50 minutes.    Alford HighlandWIETING, Janney Priego M.D on 05/02/2016 at 4:07 PM  Between 7am to 6pm - Pager - [phone]  After 6pm call admission pager [phone]  [facility] Office  [phone]  CC: Primary care physician; Derwood KaplanEason,  Ernest B, MD

## 2016-05-03 DIAGNOSIS — R55 Syncope and collapse: Secondary | ICD-10-CM | POA: Diagnosis not present

## 2016-05-03 LAB — CBC
HCT: 38.9 % — ABNORMAL LOW (ref 40.0–52.0)
Hemoglobin: 12.9 g/dL — ABNORMAL LOW (ref 13.0–18.0)
MCH: 28 pg (ref 26.0–34.0)
MCHC: 33.2 g/dL (ref 32.0–36.0)
MCV: 84.3 fL (ref 80.0–100.0)
PLATELETS: 161 10*3/uL (ref 150–440)
RBC: 4.61 MIL/uL (ref 4.40–5.90)
RDW: 14.9 % — AB (ref 11.5–14.5)
WBC: 9.2 10*3/uL (ref 3.8–10.6)

## 2016-05-03 LAB — BASIC METABOLIC PANEL
Anion gap: 7 (ref 5–15)
BUN: 29 mg/dL — ABNORMAL HIGH (ref 6–20)
CALCIUM: 8.5 mg/dL — AB (ref 8.9–10.3)
CO2: 26 mmol/L (ref 22–32)
CREATININE: 1.14 mg/dL (ref 0.61–1.24)
Chloride: 105 mmol/L (ref 101–111)
GFR calc Af Amer: >60 mL/min (ref >60)
GLUCOSE: 95 mg/dL (ref 65–99)
Potassium: 3.9 mmol/L (ref 3.5–5.1)
Sodium: 138 mmol/L (ref 135–145)

## 2016-05-03 LAB — URINALYSIS, COMPLETE (UACMP) WITH MICROSCOPIC
BILIRUBIN URINE: NEGATIVE
Bacteria, UA: NONE SEEN
GLUCOSE, UA: NEGATIVE mg/dL
HGB URINE DIPSTICK: NEGATIVE
KETONES UR: NEGATIVE mg/dL
LEUKOCYTES UA: NEGATIVE
NITRITE: NEGATIVE
PH: 6 (ref 5.0–8.0)
PROTEIN: NEGATIVE mg/dL
Specific Gravity, Urine: 1.01 (ref 1.005–1.030)
Squamous Epithelial / LPF: NONE SEEN

## 2016-05-03 NOTE — Care Management Obs Status (Signed)
MEDICARE OBSERVATION STATUS NOTIFICATION   Patient Details  Name: Malik Dean MRN: [id number] Date of Birth: 1940/09/24   Medicare Observation Status Notification Given:  Yes    Eber HongGreene, Breezy Hertenstein R, RN 05/03/2016, 10:16 AM

## 2016-05-03 NOTE — Progress Notes (Signed)
[facility] - [place] at [facility]   PATIENT NAME: Malik Dean    MR#:  [id number]  DATE OF BIRTH:  Dec 28, 1940  SUBJECTIVE: he  is seen at bedside, admitted for symptomatic bradycardia. Patient heart rate was low around 30s to 40s on admission. And has dementia, by forearm is at bedside and able to give some history. Heart rate is around the 50s to 60s on telemetry. Amiodarone has been stopped because of issues with heart rate. Patient also was on Cardizem which is already stopped at this time.   CHIEF COMPLAINT:   Chief Complaint  Patient presents with  . Hypotension  . Loss of Consciousness    REVIEW OF SYSTEMS:   ROS CONSTITUTIONAL: No fever, fatigue or weakness.  EYES: No blurred or double vision.  EARS, NOSE, AND THROAT: No tinnitus or ear pain.  RESPIRATORY: No cough, shortness of breath, wheezing or hemoptysis.  CARDIOVASCULAR: No chest pain, orthopnea, edema.  GASTROINTESTINAL: No nausea, vomiting, diarrhea or abdominal pain.  GENITOURINARY: No dysuria, hematuria.  ENDOCRINE: No polyuria, nocturia,  HEMATOLOGY: No anemia, easy bruising or bleeding SKIN: No rash or lesion. MUSCULOSKELETAL: No joint pain or arthritis.   NEUROLOGIC: No tingling, numbness, weakness.  PSYCHIATRY: No anxiety or depression.   DRUG ALLERGIES:   Allergies  Allergen Reactions  . Amlodipine Other (See Comments)    dizziness    VITALS:  Blood pressure (!) 150/62, pulse 64, temperature 98.5 F (36.9 C), temperature source Oral, resp. rate 18, weight 127.9 kg (282 lb), SpO2 94 %.  PHYSICAL EXAMINATION:  GENERAL:  76 y.o.-year-old patient lying in the bed with no acute distress.  EYES: Pupils equal, round, reactive to light and accommodation. No scleral icterus. Extraocular muscles intact.  HEENT: Head atraumatic, normocephalic. Oropharynx and nasopharynx clear.  NECK:  Supple, no jugular venous distention. No thyroid enlargement, no tenderness.  LUNGS: Normal  breath sounds bilaterally, no wheezing, rales,rhonchi or crepitation. No use of accessory muscles of respiration.  CARDIOVASCULAR: S1, S2 normal. No murmurs, rubs, or gallops.  ABDOMEN: Soft, nontender, nondistended. Bowel sounds present. No organomegaly or mass.  EXTREMITIES: No pedal edema, cyanosis, or clubbing.  NEUROLOGIC: Cranial nerves II through XII are intact. Muscle strength 5/5 in all extremities. Sensation intact. Gait not checked.  PSYCHIATRIC: The patient is alert and oriented x 3.  SKIN: No obvious rash, lesion, or ulcer.    LABORATORY PANEL:   CBC  Recent Labs Lab 05/03/16 0423  WBC 9.2  HGB 12.9*  HCT 38.9*  PLT 161   ------------------------------------------------------------------------------------------------------------------  Chemistries   Recent Labs Lab 05/02/16 1426 05/03/16 0423  NA 139 138  K 3.9 3.9  CL 104 105  CO2 27 26  GLUCOSE 126* 95  BUN 29* 29*  CREATININE 1.52* 1.14  CALCIUM 8.8* 8.5*  AST 22  --   ALT 18  --   ALKPHOS 68  --   BILITOT 0.7  --    ------------------------------------------------------------------------------------------------------------------  Cardiac Enzymes  Recent Labs Lab 05/02/16 1426  TROPONINI <0.03   ------------------------------------------------------------------------------------------------------------------  RADIOLOGY:  Dg Chest 2 View  Result Date: 05/02/2016 CLINICAL DATA:  Syncopal episode today. EXAM: CHEST  2 VIEW COMPARISON:  06/26/2012 FINDINGS: The cardiac silhouette remains mildly enlarged. Sequelae of prior cardiac valve replacement are again identified. Aortic atherosclerosis is noted. The lungs are mildly hypoinflated without evidence of airspace consolidation, edema, pleural effusion, or pneumothorax. No acute osseous abnormality is seen. IMPRESSION: 1. No evidence of acute cardiopulmonary process. 2. Aortic atherosclerosis. Electronically  Signed   By: Sebastian AcheAllen  Grady M.D.   On:  05/02/2016 15:43   Ct Head Wo Contrast  Result Date: 05/02/2016 CLINICAL DATA:  Multiple syncopal episodes. Weakness. History of hypertension, diabetes. EXAM: CT HEAD WITHOUT CONTRAST TECHNIQUE: Contiguous axial images were obtained from the base of the skull through the vertex without intravenous contrast. COMPARISON:  MRI of the head November 29, 2015 FINDINGS: BRAIN: No intraparenchymal hemorrhage, mass effect, midline shift or acute large vascular territory infarcts. Old bilateral basal ganglia, thalamus lacunar infarcts. Old LEFT greater than RIGHT cerebellar infarcts. Ventricles and sulci are normal for patient's age. Patchy to confluent supratentorial white matter hypodensities. Small area RIGHT inferior frontal lobe encephalomalacia. No abnormal extra-axial fluid collections. Basal cisterns are patent. VASCULAR: Moderate calcific atherosclerosis of the carotid siphons and intradural vertebral artery's. SKULL: No skull fracture. No significant scalp soft tissue swelling. SINUSES/ORBITS: The mastoid air-cells and included paranasal sinuses are well-aerated.The included ocular globes and orbital contents are non-suspicious. OTHER: None. IMPRESSION: No acute intracranial process. Stable examination: Multiple old infarcts, RIGHT inferior frontal lobe encephalomalacia most compatible with old trauma, moderate to severe chronic small vessel ischemic disease Electronically Signed   By: Awilda Metroourtnay  Bloomer M.D.   On: 05/02/2016 16:33    EKG:   Orders placed or performed during the hospital encounter of 05/02/16  . ED EKG  . ED EKG  . EKG 12-Lead  . EKG 12-Lead    ASSESSMENT AND PLAN:  Sinus bradycardia, syncope.: Patient's amiodarone has been stopped as an outpatient. And the AV nodal drugs are stopped. Continue to monitor on telemetry. Cardiology is already consulted. If no further cardiac workup patient can be discharged home tomorrow monitor on telemetry today for further arrhythmias or  bradycardia.  #2. acute renal failure with ATN due to hypotension, dehydration,;; patient on IV fluids, kidney function improved, blood pressure improved.  #3. History of aortic stenosis with aortic valve replacement with bioprosthetic aortic valve, history of CABG: Hold amiodarone, only aspirin and Plavix, not on full dose anticoagulation because of dementia.  4.d/w wife/   All the records are reviewed and case discussed with Care Management/Social Workerr. Management plans discussed with the patient, family and they are in agreement.  CODE STATUS: full  TOTAL TIME TAKING CARE OF THIS PATIENT:35 minutes.   POSSIBLE D/C IN 1-2DAYS, DEPENDING ON CLINICAL CONDITION.   Katha HammingKONIDENA,Daquan Crapps M.D on 05/03/2016 at 12:33 PM  Between 7am to 6pm - Pager - [phone]  After 6pm go to [email or web address] - password [email or web address] [email or web address]  [facility]  Office  [phone]  CC: Primary care physician; Derwood KaplanEason,  Ernest B, MD   Note: This dictation was prepared with Dragon dictation along with smaller phrase technology. Any transcriptional errors that result from this process are unintentional.

## 2016-05-03 NOTE — Progress Notes (Signed)
[facility]  SUBJECTIVE: Feels a little better. Difficult historian. Heart rate improved.    Vitals:   05/02/16 2018 05/03/16 0615 05/03/16 0759 05/03/16 1122  BP: 134/60 (!) 142/69 (!) 160/67 (!) 150/62  Pulse: (!) 51 63 65 64  Resp: 18 16  18   Temp: 97.9 F (36.6 C) 98 F (36.7 C)  98.5 F (36.9 C)  TempSrc: Oral Oral  Oral  SpO2: 96% 93% 94% 94%  Weight:        Intake/Output Summary (Last 24 hours) at 05/03/16 1316 Last data filed at 05/03/16 0641  Gross per 24 hour  Intake             1096 ml  Output              100 ml  Net              996 ml    LABS: Basic Metabolic Panel:  Recent Labs  16/01/9600/09/18 1426 05/03/16 0423  NA 139 138  K 3.9 3.9  CL 104 105  CO2 27 26  GLUCOSE 126* 95  BUN 29* 29*  CREATININE 1.52* 1.14  CALCIUM 8.8* 8.5*   Liver Function Tests:  Recent Labs  05/02/16 1426  AST 22  ALT 18  ALKPHOS 68  BILITOT 0.7  PROT 6.0*  ALBUMIN 3.1*   No results for input(s): LIPASE, AMYLASE in the last 72 hours. CBC:  Recent Labs  05/02/16 1446 05/03/16 0423  WBC 8.4 9.2  NEUTROABS 6.4  --   HGB 12.9* 12.9*  HCT 39.1* 38.9*  MCV 84.1 84.3  PLT 168 161   Cardiac Enzymes:  Recent Labs  05/02/16 1426  TROPONINI <0.03   BNP: Invalid input(s): POCBNP D-Dimer: No results for input(s): DDIMER in the last 72 hours. Hemoglobin A1C: No results for input(s): HGBA1C in the last 72 hours. Fasting Lipid Panel: No results for input(s): CHOL, HDL, LDLCALC, TRIG, CHOLHDL, LDLDIRECT in the last 72 hours. Thyroid Function Tests: No results for input(s): TSH, T4TOTAL, T3FREE, THYROIDAB in the last 72 hours.  Invalid input(s): FREET3 Anemia Panel: No results for input(s): VITAMINB12, FOLATE, FERRITIN, TIBC, IRON, RETICCTPCT in the last 72 hours.   Physical Exam: Blood pressure (!) 150/62, pulse 64, temperature 98.5 F (36.9 C), temperature source Oral, resp. rate 18, weight 282 lb (127.9 kg), SpO2 94 %.    Wt Readings from Last 1 Encounters:  05/02/16 282 lb (127.9 kg)     General appearance: cooperative Resp: clear to auscultation bilaterally Cardio: regular rate and rhythm GI: soft, non-tender; bowel sounds normal; no masses,  no organomegaly Extremities: extremities normal, atraumatic, no cyanosis or edema Neurologic: Grossly normal  TELEMETRY: Reviewed telemetry pt in nsr with occaisional sinus bradycradia with no pauses. :  ASSESSMENT AND PLAN:  Active Problems:   Bradycardia-improved. Remain off of all av nodal drugs. Does not meet criteria for ppm at present.  Aortic valve disease-s/p avr. No evidence of valve prosthesis malfunction  Acute renal failure-improved with rehydration.    No further inpatient cardiac workup indicated at present.     Dalia HeadingKenneth A Makaria Poarch, MD, [facility] 05/03/2016 1:16 PM

## 2016-05-03 NOTE — Evaluation (Signed)
Physical Therapy Evaluation Patient Details Name: Malik Dean MRN: [id number] DOB: Sep 02, 1940 Today's Date: 05/03/2016   History of Present Illness  Malik Dean  is a 76 y.o. male with a known history of Dementia and unable to give much history. I called his wife on the phone and she states that they tried to get him up and he was very sleepy and just wanted to lie down even when he was in the bathroom. He did not pass out. The patient states he feels fine. He offers no complaints. In the ER his initial blood pressure was found to be low and he was found to be dehydrated and his heart rate was in the 30s and 40s.  Clinical Impression  Pt presents to PT with generalized weakness and decreased functional mobility and would benefit from acute PT services to address objective findings.  Pt needing Min-Mod assist for bed mobility and transfers and CGA for ambulation with RW.  Wife is primary caretaker and reporting pt has become weaker over the past several months and is now no longer ambulating outside the home.     Follow Up Recommendations Home health PT    Equipment Recommendations  None recommended by PT (has needed)    Recommendations for Other Services       Precautions / Restrictions Precautions Precautions: Fall Restrictions Weight Bearing Restrictions: No      Mobility  Bed Mobility Overal bed mobility: Needs Assistance Bed Mobility: Supine to Sit;Sit to Supine     Supine to sit: Min assist Sit to supine: Min guard   General bed mobility comments: Needs verbal cues for positioning in bed, to slide up in bed onto pillows and center himself.  Transfers Overall transfer level: Needs assistance Equipment used: Rolling walker (2 wheeled) Transfers: Sit to/from Stand Sit to Stand: Mod assist         General transfer comment: Needs verbal cues to push up from seated surface and not pull on RW;  difficulty with lift off with decreased muscle  power.  Ambulation/Gait Ambulation/Gait assistance: Min guard Ambulation Distance (Feet): 40 Feet Assistive device: Rolling walker (2 wheeled) (bariatric) Gait Pattern/deviations: Step-through pattern;Shuffle;Decreased step length - right;Decreased step length - left;Festinating Gait velocity: decreased Gait velocity interpretation: Below normal speed for age/gender General Gait Details: Slow shuffled steps, responds to verbal cues to take larger steps.  Stairs            Wheelchair Mobility    Modified Rankin (Stroke Patients Only)       Balance Overall balance assessment: Needs assistance Sitting-balance support: Feet unsupported Sitting balance-Leahy Scale: Fair   Postural control: Posterior lean Standing balance support: Bilateral upper extremity supported;During functional activity Standing balance-Leahy Scale: Fair                               Pertinent Vitals/Pain Pain Assessment: No/denies pain    Home Living Family/patient expects to be discharged to:: Private residence Living Arrangements: Spouse/significant other Available Help at Discharge: Personal care attendant (1-2x/week for bathing) Type of Home: House Home Access: Stairs to enter Entrance Stairs-Rails: Left;Can reach Therapist, art of Steps: 3 ("not that many") Home Layout: One level        Prior Function Level of Independence: Needs assistance   Gait / Transfers Assistance Needed: RW for ambulation with Mod I; Min-Mod A for sit<>stand transfers  ADL's / Homemaking Assistance Needed: able to feed self, but forgets to  eat requiring verbal cues; total assist for personal hygeine and pericare; incontinent of stool  Comments: Amb with RW household distances, no longer leaves home; wife full time care giver.  Has HHPT.     Hand Dominance   Dominant Hand: Right    Extremity/Trunk Assessment   Upper Extremity Assessment Upper Extremity Assessment:  Generalized weakness    Lower Extremity Assessment Lower Extremity Assessment: Generalized weakness       Communication   Communication: HOH  Cognition Arousal/Alertness: Awake/alert Behavior During Therapy: Flat affect Overall Cognitive Status: History of cognitive impairments - at baseline                      General Comments General comments (skin integrity, edema, etc.): Incontinent of stool, [facility] and required total assist to get cleaned up.    Exercises Other Exercises Other Exercises: Standing marching with high knees, seated LAQ; static and dynamic balance activities.   Assessment/Plan    PT Assessment Patient needs continued PT services  PT Problem List Decreased strength;Decreased activity tolerance;Decreased balance;Decreased mobility;Decreased coordination;Decreased knowledge of use of DME;Decreased safety awareness          PT Treatment Interventions DME instruction;Gait training;Stair training;Functional mobility training;Therapeutic activities;Therapeutic exercise;Balance training;Patient/family education    PT Goals (Current goals can be found in the Care Plan section)  Acute Rehab PT Goals Patient Stated Goal: None stated PT Goal Formulation: With patient Time For Goal Achievement: 05/17/16 Potential to Achieve Goals: Fair    Frequency Min 2X/week   Barriers to discharge Decreased caregiver support wife with limited ability to take care of pt at current level    Co-evaluation               End of Session Equipment Utilized During Treatment: Gait belt Activity Tolerance: Patient tolerated treatment well Patient left: in bed;with call bell/phone within reach;with bed alarm set;with nursing/sitter in room Nurse Communication: Mobility status    Functional Assessment Tool Used: AMPAC 6 Functional Limitation: Mobility: Walking and moving around Mobility: Walking and Moving Around Current Status ([id number](G8978): At least 40 percent  but less than 60 percent impaired, limited or restricted Mobility: Walking and Moving Around Goal Status [phone](G8979): At least 20 percent but less than 40 percent impaired, limited or restricted    Time: 1154-1225 PT Time Calculation (min) (ACUTE ONLY): 31 min   Charges:   PT Evaluation $PT Eval Moderate Complexity: 1 Procedure PT Treatments $Therapeutic Exercise: 8-22 mins   PT G Codes:   PT G-Codes **NOT FOR INPATIENT CLASS** Functional Assessment Tool Used: AMPAC 6 Functional Limitation: Mobility: Walking and moving around Mobility: Walking and Moving Around Current Status ([id number](G8978): At least 40 percent but less than 60 percent impaired, limited or restricted Mobility: Walking and Moving Around Goal Status [phone](G8979): At least 20 percent but less than 40 percent impaired, limited or restricted    Pulte Homesisele A Jisele Price, PT 05/03/2016, 12:43 PM

## 2016-05-04 DIAGNOSIS — R55 Syncope and collapse: Secondary | ICD-10-CM | POA: Diagnosis not present

## 2016-05-04 NOTE — Progress Notes (Signed)
Discharge instructions along with home medication list and follow up gone over with patient. Patient verbalized that he understood instructions. No rx given to patient. No c/o pain no distress noted. Iv and telemetry removed. Waiting on wife to transport home

## 2016-05-04 NOTE — Progress Notes (Signed)
[facility]  SUBJECTIVE: no chest pain.   Vitals:   05/03/16 1122 05/03/16 2014 05/04/16 0352 05/04/16 0822  BP: (!) 150/62 (!) 154/63 (!) 155/58 (!) 148/63  Pulse: 64 68 63 66  Resp: 18 18 18 17   Temp: 98.5 F (36.9 C) 98.3 F (36.8 C) 98.3 F (36.8 C)   TempSrc: Oral Oral Oral   SpO2: 94% 93% 96% 95%  Weight:        Intake/Output Summary (Last 24 hours) at 05/04/16 0839 Last data filed at 05/04/16 0655  Gross per 24 hour  Intake              480 ml  Output              200 ml  Net              280 ml    LABS: Basic Metabolic Panel:  Recent Labs  21/30/8600/01/09 1426 05/03/16 0423  NA 139 138  K 3.9 3.9  CL 104 105  CO2 27 26  GLUCOSE 126* 95  BUN 29* 29*  CREATININE 1.52* 1.14  CALCIUM 8.8* 8.5*   Liver Function Tests:  Recent Labs  05/02/16 1426  AST 22  ALT 18  ALKPHOS 68  BILITOT 0.7  PROT 6.0*  ALBUMIN 3.1*   No results for input(s): LIPASE, AMYLASE in the last 72 hours. CBC:  Recent Labs  05/02/16 1446 05/03/16 0423  WBC 8.4 9.2  NEUTROABS 6.4  --   HGB 12.9* 12.9*  HCT 39.1* 38.9*  MCV 84.1 84.3  PLT 168 161   Cardiac Enzymes:  Recent Labs  05/02/16 1426  TROPONINI <0.03   BNP: Invalid input(s): POCBNP D-Dimer: No results for input(s): DDIMER in the last 72 hours. Hemoglobin A1C: No results for input(s): HGBA1C in the last 72 hours. Fasting Lipid Panel: No results for input(s): CHOL, HDL, LDLCALC, TRIG, CHOLHDL, LDLDIRECT in the last 72 hours. Thyroid Function Tests: No results for input(s): TSH, T4TOTAL, T3FREE, THYROIDAB in the last 72 hours.  Invalid input(s): FREET3 Anemia Panel: No results for input(s): VITAMINB12, FOLATE, FERRITIN, TIBC, IRON, RETICCTPCT in the last 72 hours.   Physical Exam: Blood pressure (!) 148/63, pulse 66, temperature 98.3 F (36.8 C), temperature source Oral, resp. rate 17, weight 282 lb (127.9 kg), SpO2 95 %.   Wt Readings from Last 1 Encounters:   05/02/16 282 lb (127.9 kg)     General appearance: cooperative Resp: clear to auscultation bilaterally Cardio: regular rate and rhythm GI: soft, non-tender; bowel sounds normal; no masses,  no organomegaly Extremities: extremities normal, atraumatic, no cyanosis or edema Neurologic: Grossly normal  TELEMETRY: Reviewed telemetry pt in nsr. No significant bradycardia or pauses.   ASSESSMENT AND PLAN:  Active Problems:   Bradycardia-improved off of av nodal drugs. No further bradycardia noted. No further cardiac workup indicated at present.     Dalia HeadingKenneth A Dorsie Burich, MD, [facility] 05/04/2016 8:39 AM

## 2016-05-04 NOTE — Care Management (Signed)
[facility]  Requested order to resume (as unable to specifically identify which services were being provided) and notified Anibal Hendersonim Henderson with the agency

## 2016-05-05 LAB — URINE CULTURE

## 2016-05-07 NOTE — Discharge Summary (Signed)
Malik Dean, is a 76 y.o. male  DOB 13-Mar-1941  MRN [id number].  Admission date:  05/02/2016  Admitting Physician  Alford Highland, MD  Discharge Date:  05/04/2016   Primary MD  Derwood Kaplan, MD  Recommendations for primary care physician for things to follow:   Follow-up with R Dr. Theda Sers  in 1 week\   Admission Diagnosis  Syncope and collapse [R55] Dehydration [E86.0] Bradycardia [R00.1] Weakness [R53.1] AKI (acute kidney injury) (HCC) [N17.9]   Discharge Diagnosis  Syncope and collapse [R55] Dehydration [E86.0] Bradycardia [R00.1] Weakness [R53.1] AKI (acute kidney injury) (HCC) [N17.9]   Active Problems:   Bradycardia      Past Medical History:  Diagnosis Date  . CAD (coronary artery disease)   . Diabetes mellitus without complication (HCC)   . Heart valve replaced   . Hypertension   . MI (myocardial infarction)   . Rheumatic fever     Past Surgical History:  Procedure Laterality Date  . CORONARY ARTERY BYPASS GRAFT    . ORIF ANKLE FRACTURE    . WRIST SURGERY         History of present illness and  Hospital Course:     Kindly see H&P for history of present illness and admission details, please review complete Labs, Consult reports and Test reports for all details in brief  HPI  from the history and physical done on the day of admission  Hospital Course   76 year old male male patient with history of coronary artery disease, severe dementia, essential hypertension, aortic stenosis with bioprosthetic aortic valve, CABG brought in by the wife that she. Found to . Found to have bradycardia with heart rate 30s to 40s. Patient was taken off amiodarone as an outpatient, creatinine admission 1.5 to with normal creatinine at baseline. Admitted to hospitalist service for bradycardia, acute  kidney injury. Patient received IV fluids, monitor on telemetry, seen by cardiology, #1. for bradycardia stop reviewed all  Av nodal  drugs, heart rate improved to 60s. Discharge home with home  without amiodarone or Cardizem.  #2 history of coronary artery disease, aortic stenosis with  bioprosthetic aortic valve: Patient on aspirin, Plavix. #3 acute kidney injury due to dehydration: Improved with IV fluids. discharged home with home health.   Discharge Condition: stable   Follow UP  Follow-up Information    Derwood Kaplan, MD. Go on 05/10/2016.   Specialty:  Internal Medicine Why:  Time: 10:30 a.m. Contact information: [address] [place] [address] [phone]        Dalia Heading, MD. Go on 05/11/2016.   Specialty:  Cardiology Why:  Time: 9:45 a.m. Contact information: [address] [place] [address] [phone]        [facility] Follow up.   Specialty:  [facility] Contact information: [address] [place] [address] [place] [address] [phone]             Discharge Instructions  and  Discharge Medications     Discharge Instructions    Face-to-face encounter (required for Medicare/Medicaid patients)    Complete by:  As directed    I [facility] certify that this patient is under my care and that I, or a nurse practitioner or physician's assistant working with me, had a face-to-face encounter that meets the physician face-to-face encounter requirements with this patient on 05/04/2016. The encounter with the patient was in whole, or in part for the following medical condition(s) which is the  primary reason for home health care  bradycardia Deconditioning   The encounter with the patient was in whole, or in part, for the following medical condition, which is the primary reason for home health care:  whole   I certify that, based on my findings, the following services are medically necessary home health services:  Physical  therapy   Reason for Medically Necessary Home Health Services:  Therapy- Investment banker, operational, Patent examiner   My clinical findings support the need for the above services:  Unable to leave home safely without assistance and/or assistive device   Further, I certify that my clinical findings support that this patient is homebound due to:  Ambulates short distances less than 300 feet   Home Health    Complete by:  As directed    To provide the following care/treatments:  PT     Allergies as of 05/04/2016      Reactions   Amlodipine Other (See Comments)   dizziness      Medication List    STOP taking these medications   diltiazem 240 MG 24 hr capsule Commonly known as:  CARDIZEM CD     TAKE these medications   aspirin EC 81 MG tablet Take 1 tablet by mouth daily.   atorvastatin 40 MG tablet Commonly known as:  LIPITOR Take 1 tablet by mouth daily at 6 PM.   clopidogrel 75 MG tablet Commonly known as:  PLAVIX Take 75 mg by mouth daily.   donepezil 10 MG tablet Commonly known as:  ARICEPT Take 10 mg by mouth at bedtime.   doxazosin 1 MG tablet Commonly known as:  CARDURA Take 1 mg by mouth daily.   escitalopram 20 MG tablet Commonly known as:  LEXAPRO Take 20 mg by mouth daily.   finasteride 5 MG tablet Commonly known as:  PROSCAR Take 1 tablet (5 mg total) by mouth daily.   magnesium 30 MG tablet Take by mouth daily.         Diet and Activity recommendation: See Discharge Instructions above   Consults obtained - cardio   Major procedures and Radiology Reports - PLEASE review detailed and final reports for all details, in brief -     Dg Chest 2 View  Result Date: 05/02/2016 CLINICAL DATA:  Syncopal episode today. EXAM: CHEST  2 VIEW COMPARISON:  06/26/2012 FINDINGS: The cardiac silhouette remains mildly enlarged. Sequelae of prior cardiac valve replacement are again identified. Aortic atherosclerosis is noted. The lungs are mildly  hypoinflated without evidence of airspace consolidation, edema, pleural effusion, or pneumothorax. No acute osseous abnormality is seen. IMPRESSION: 1. No evidence of acute cardiopulmonary process. 2. Aortic atherosclerosis. Electronically Signed   By: Sebastian Ache M.D.   On: 05/02/2016 15:43   Ct Head Wo Contrast  Result Date: 05/02/2016 CLINICAL DATA:  Multiple syncopal episodes. Weakness. History of hypertension, diabetes. EXAM: CT HEAD WITHOUT CONTRAST TECHNIQUE: Contiguous axial images were obtained from the base of the skull through the vertex without intravenous contrast. COMPARISON:  MRI of the head November 29, 2015 FINDINGS: BRAIN: No intraparenchymal hemorrhage, mass effect, midline shift or acute large vascular territory infarcts. Old bilateral basal ganglia, thalamus lacunar infarcts. Old LEFT greater than RIGHT cerebellar infarcts. Ventricles and sulci are normal for patient's age. Patchy to confluent supratentorial white matter hypodensities. Small area RIGHT inferior frontal lobe encephalomalacia. No abnormal extra-axial fluid collections. Basal cisterns are patent. VASCULAR: Moderate calcific atherosclerosis of the carotid siphons and intradural vertebral artery's. SKULL: No  skull fracture. No significant scalp soft tissue swelling. SINUSES/ORBITS: The mastoid air-cells and included paranasal sinuses are well-aerated.The included ocular globes and orbital contents are non-suspicious. OTHER: None. IMPRESSION: No acute intracranial process. Stable examination: Multiple old infarcts, RIGHT inferior frontal lobe encephalomalacia most compatible with old trauma, moderate to severe chronic small vessel ischemic disease Electronically Signed   By: Awilda Metroourtnay  Bloomer M.D.   On: 05/02/2016 16:33    Micro Results    Recent Results (from the past 240 hour(s))  Urine culture     Status: Abnormal   Collection Time: 05/02/16  6:25 PM  Result Value Ref Range Status   Specimen Description URINE, RANDOM   Final   Special Requests NONE  Final   Culture MULTIPLE SPECIES PRESENT, SUGGEST RECOLLECTION (A)  Final   Report Status 05/05/2016 FINAL  Final       Today   Subjective:   Malik Katrinka BlazingSmith today has dementia but stable for discharge.  Objective:   Blood pressure (!) 162/59, pulse 67, temperature 98.2 F (36.8 C), temperature source Oral, resp. rate 18, weight 127.9 kg (282 lb), SpO2 95 %.  No intake or output data in the 24 hours ending 05/07/16 1339  Exam Awake Alert, Oriented x 3, No new F.N deficits, Normal affect [place].AT,PERRAL Supple Neck,No JVD, No cervical lymphadenopathy appriciated.  Symmetrical Chest wall movement, Good air movement bilaterally, CTAB RRR,No Gallops,Rubs or new Murmurs, No Parasternal Heave +ve B.Sounds, Abd Soft, Non tender, No organomegaly appriciated, No rebound -guarding or rigidity. No Cyanosis, Clubbing or edema, No new Rash or bruise  Data Review   CBC w Diff:  Lab Results  Component Value Date   WBC 9.2 05/03/2016   HGB 12.9 (L) 05/03/2016   HGB 13.5 09/09/2012   HCT 38.9 (L) 05/03/2016   HCT 40.6 09/09/2012   PLT 161 05/03/2016   PLT 163 09/09/2012   LYMPHOPCT 16 05/02/2016   LYMPHOPCT 14.7 06/27/2012   MONOPCT 7 05/02/2016   MONOPCT 6.7 06/27/2012   EOSPCT 1 05/02/2016   EOSPCT 4.3 06/27/2012   BASOPCT 0 05/02/2016   BASOPCT 1.0 06/27/2012    CMP:  Lab Results  Component Value Date   NA 138 05/03/2016   NA 137 09/09/2012   K 3.9 05/03/2016   K 3.9 09/09/2012   CL 105 05/03/2016   CL 104 09/09/2012   CO2 26 05/03/2016   CO2 28 09/09/2012   BUN 29 (H) 05/03/2016   BUN 19 (H) 09/09/2012   CREATININE 1.14 05/03/2016   CREATININE 1.00 09/09/2012   PROT 6.0 (L) 05/02/2016   PROT 6.9 06/26/2012   ALBUMIN 3.1 (L) 05/02/2016   ALBUMIN 3.0 (L) 06/26/2012   BILITOT 0.7 05/02/2016   BILITOT 0.9 06/26/2012   ALKPHOS 68 05/02/2016   ALKPHOS 96 06/26/2012   AST 22 05/02/2016   AST 20 06/26/2012   ALT 18 05/02/2016   ALT  20 06/26/2012  .   Total Time in preparing paper work, data evaluation and todays exam - 35 minutes  Malik Dean M.D on 05/04/2016 at 1:39 PM    Note: This dictation was prepared with Dragon dictation along with smaller phrase technology. Any transcriptional errors that result from this process are unintentional.

## 2017-07-23 DEATH — deceased

## 2018-03-26 IMAGING — CT CT HEAD W/O CM
3 series · 15 of 47 positions shown, 18 images · non-contrast
Comparison: 06/27/2012.  05/24/2015.

CLINICAL DATA: Weakness.

EXAM:
CT HEAD WITHOUT CONTRAST
TECHNIQUE: Contiguous axial images were obtained from the base of the skull
through the vertex without intravenous contrast.

[Series 2: head wo · axial · 0.45mm/px · z∈[-65,+65]mm · 9 of 32 slices shown, 12 images]
[im 3/32  brain]
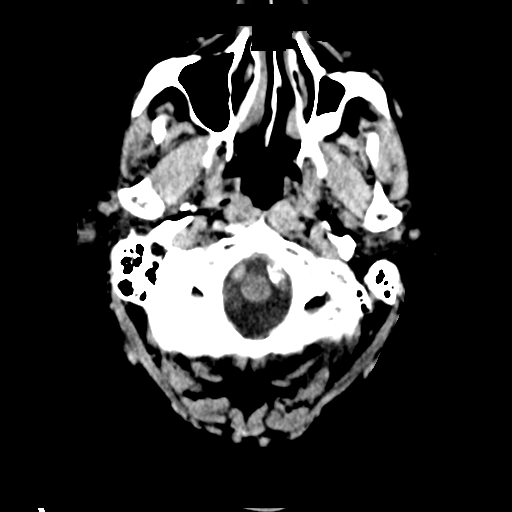
[im 3/32  bone]
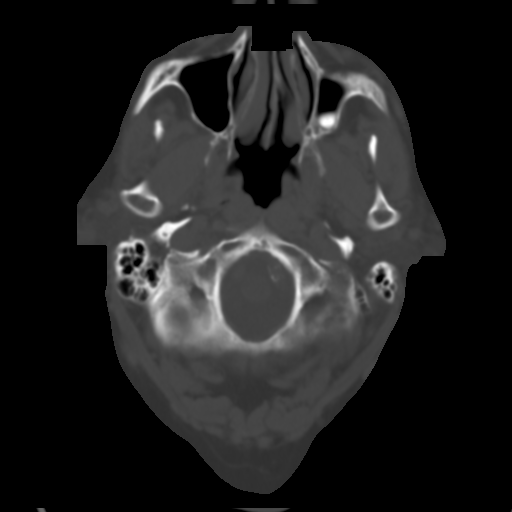
[im 6/32  brain]
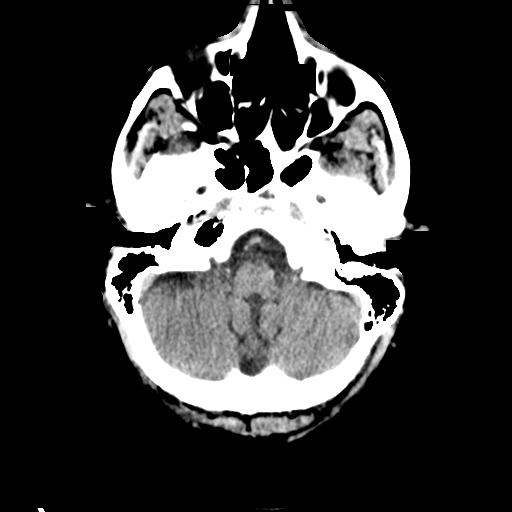
[im 9/32  brain]
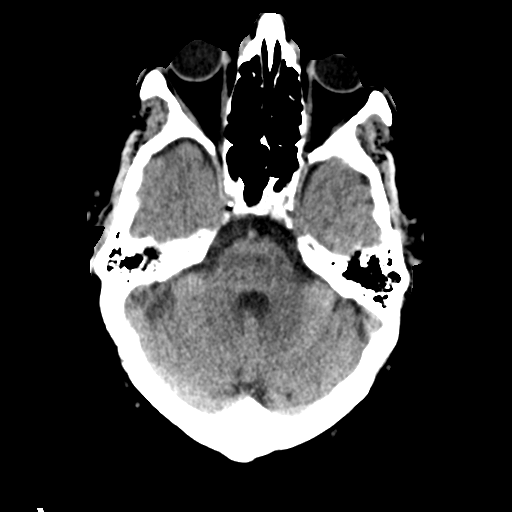
[im 12/32  brain]
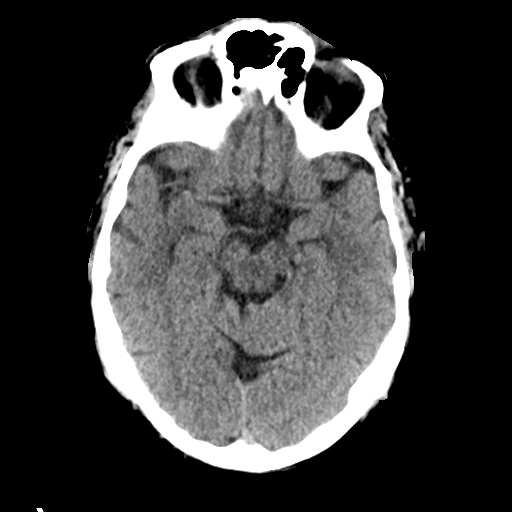
[im 17/32  brain]
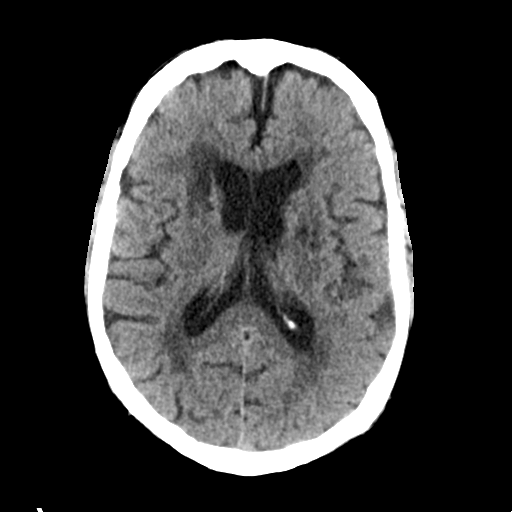
[im 17/32  bone]
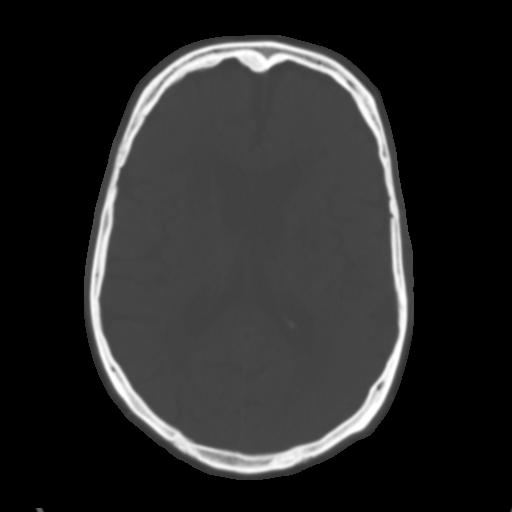
[im 20/32  brain]
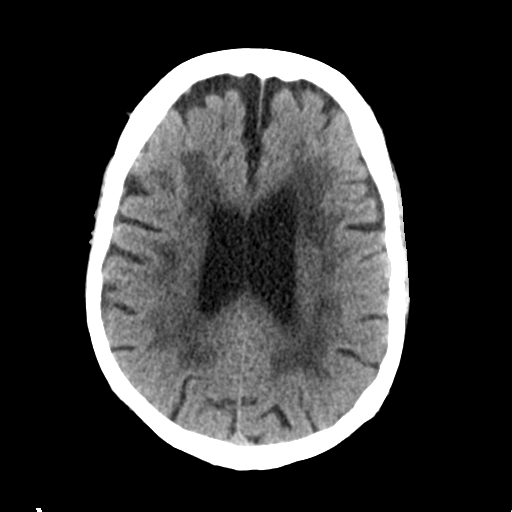
[im 23/32  brain]
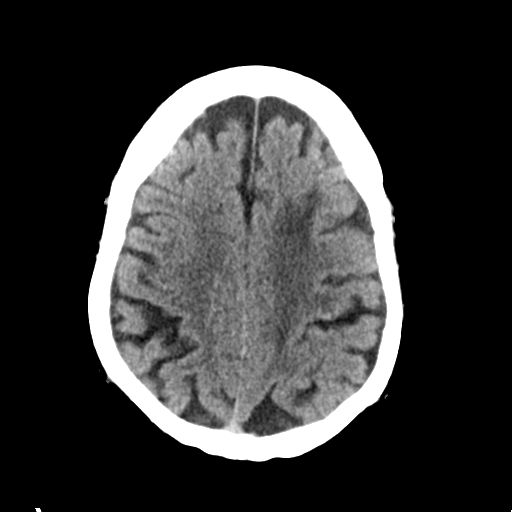
[im 26/32  brain]
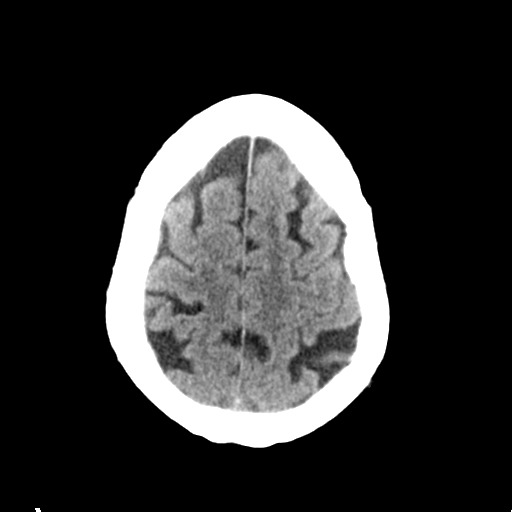
[im 29/32  brain]
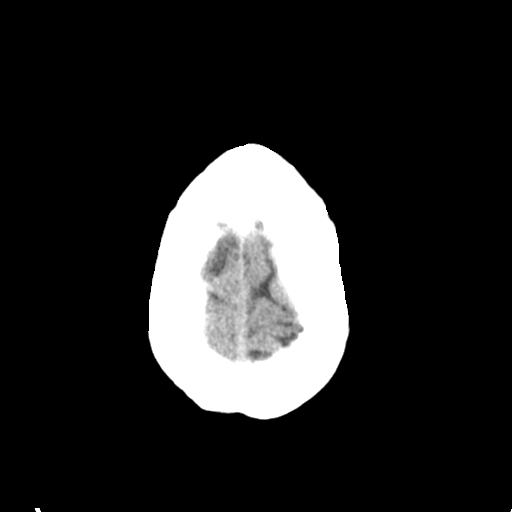
[im 29/32  bone]
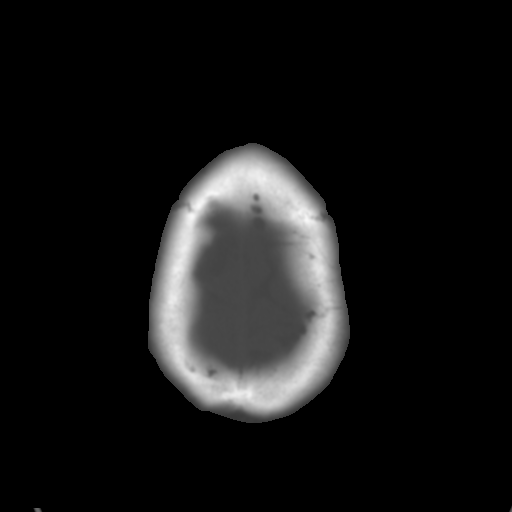

[Series 4: coronal soft tissue · coronal · 0.31mm/px · 3 of 73 slices shown]
[im 25/73  brain]
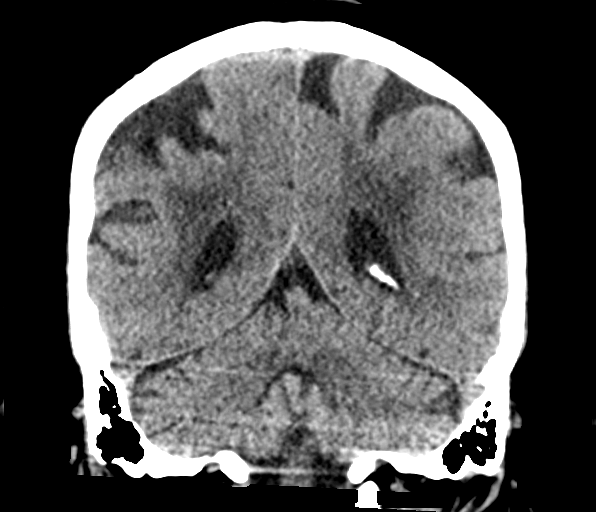
[im 33/73  brain]
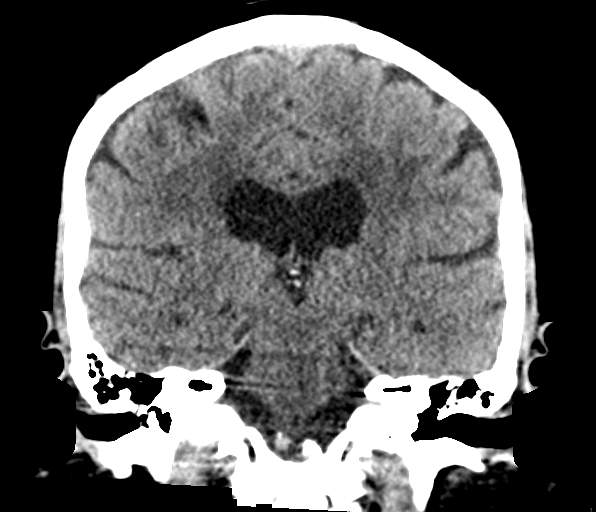
[im 41/73  brain]
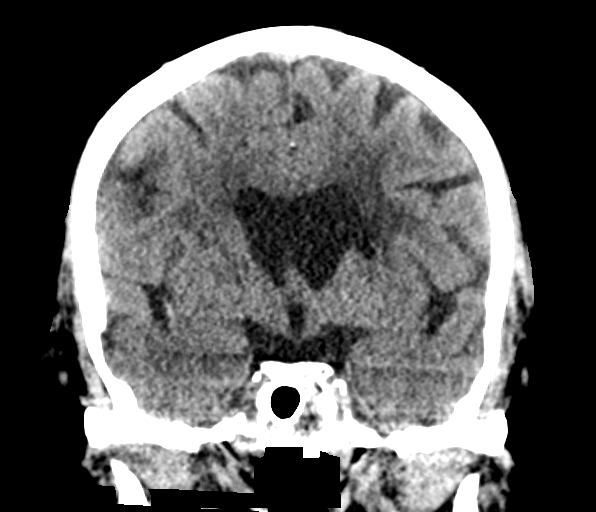

[Series 5: sagittal soft tissue · sagittal · 0.33mm/px · 3 of 53 slices shown]
[im 18/53  brain]
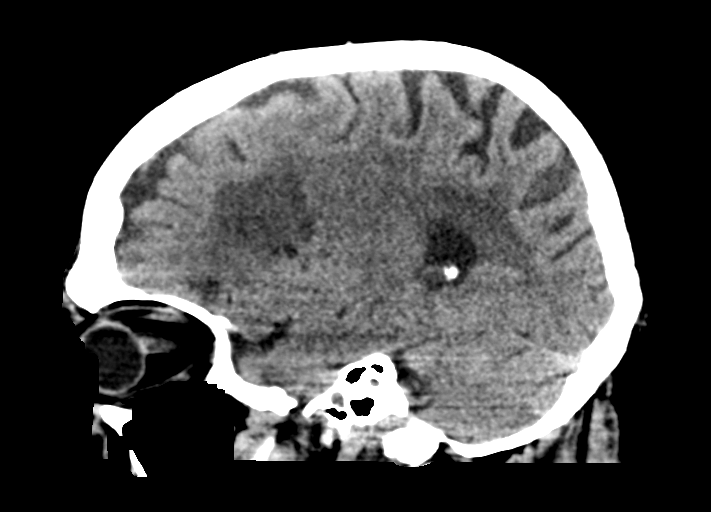
[im 27/53  brain]
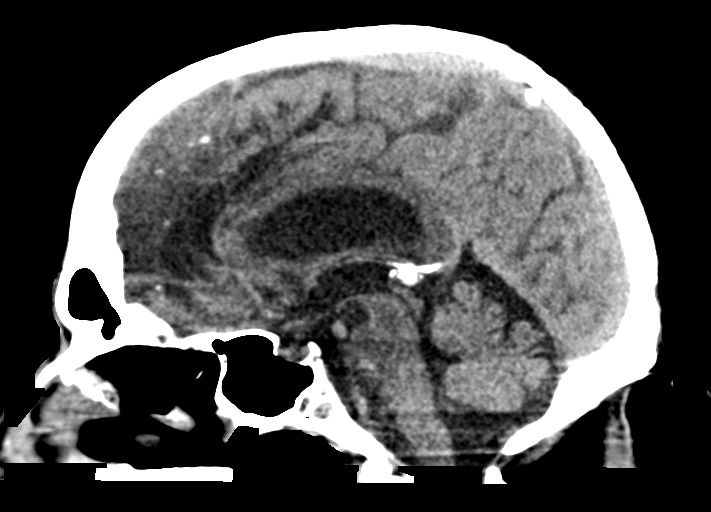
[im 35/53  brain]
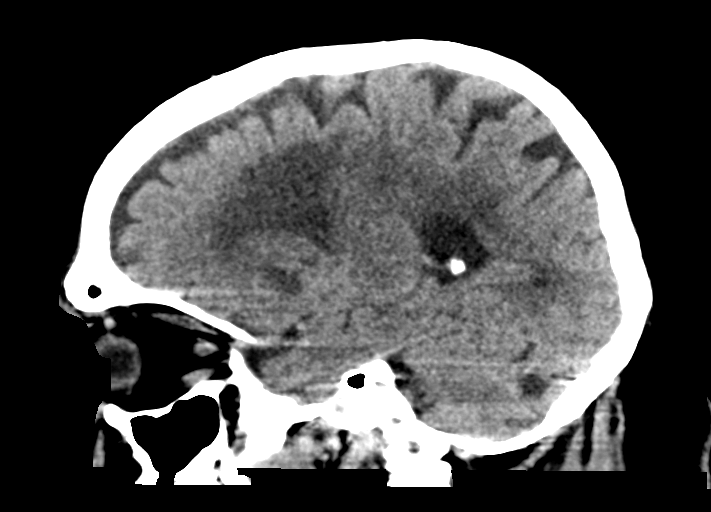

[15 of 47 positions shown; findings below may reference images not displayed]

FINDINGS: There is no evidence for acute hemorrhage, hydrocephalus, mass
lesion, or abnormal extra-axial fluid collection. No definite CT
evidence for acute infarction. Diffuse loss of parenchymal volume is
consistent with atrophy. Patchy low attenuation in the deep
hemispheric and periventricular white matter is nonspecific, but
likely reflects chronic microvascular ischemic demyelination.
Lacunar infarctions for seen in the basal ganglia bilaterally and
left cerebellar hemisphere, as before.The visualized paranasal
sinuses and mastoid air cells are clear.
IMPRESSION: 1. Stable exam.  No acute intracranial abnormality
2. Atrophy with chronic small vessel white matter ischemic
demyelination.
